# Patient Record
Sex: Male | Born: 1960
Health system: Southern US, Community
[De-identification: ages and names within clinical notes are randomized; demographics above are authoritative.]

## PROBLEM LIST (undated history)

## (undated) DIAGNOSIS — R109 Unspecified abdominal pain: Secondary | ICD-10-CM

## (undated) DIAGNOSIS — M7580 Other shoulder lesions, unspecified shoulder: Secondary | ICD-10-CM

## (undated) DIAGNOSIS — K2289 Other specified disease of esophagus: Secondary | ICD-10-CM

## (undated) DIAGNOSIS — E039 Hypothyroidism, unspecified: Secondary | ICD-10-CM

## (undated) DIAGNOSIS — R0681 Apnea, not elsewhere classified: Secondary | ICD-10-CM

## (undated) DIAGNOSIS — G4730 Sleep apnea, unspecified: Secondary | ICD-10-CM

## (undated) DIAGNOSIS — M542 Cervicalgia: Secondary | ICD-10-CM

## (undated) DIAGNOSIS — M1990 Unspecified osteoarthritis, unspecified site: Secondary | ICD-10-CM

## (undated) DIAGNOSIS — K228 Other specified diseases of esophagus: Secondary | ICD-10-CM

## (undated) DIAGNOSIS — Z87442 Personal history of urinary calculi: Secondary | ICD-10-CM

## (undated) DIAGNOSIS — K760 Fatty (change of) liver, not elsewhere classified: Secondary | ICD-10-CM

## (undated) DIAGNOSIS — I10 Essential (primary) hypertension: Secondary | ICD-10-CM

## (undated) DIAGNOSIS — E119 Type 2 diabetes mellitus without complications: Secondary | ICD-10-CM

## (undated) DIAGNOSIS — J45909 Unspecified asthma, uncomplicated: Secondary | ICD-10-CM

## (undated) HISTORY — DX: Unspecified abdominal pain: R10.9

## (undated) HISTORY — PX: COLONOSCOPY: SHX174

## (undated) HISTORY — DX: Other specified disease of esophagus: K22.89

## (undated) HISTORY — DX: Other specified diseases of esophagus: K22.8

## (undated) HISTORY — PX: NASAL FRACTURE SURGERY: SHX718

## (undated) HISTORY — PX: OTHER SURGICAL HISTORY: SHX169

## (undated) HISTORY — DX: Other shoulder lesions, unspecified shoulder: M75.80

## (undated) HISTORY — DX: Personal history of urinary calculi: Z87.442

## (undated) HISTORY — DX: Apnea, not elsewhere classified: R06.81

## (undated) HISTORY — PX: KNEE SURGERY: SHX244

## (undated) HISTORY — PX: ELBOW SURGERY: SHX618

## (undated) HISTORY — PX: FRACTURE SURGERY: SHX138

## (undated) HISTORY — DX: Cervicalgia: M54.2

---

## 1998-07-26 ENCOUNTER — Emergency Department (HOSPITAL_COMMUNITY): Admission: EM | Admit: 1998-07-26 | Discharge: 1998-07-27 | Payer: Self-pay | Admitting: Emergency Medicine

## 1999-03-15 ENCOUNTER — Emergency Department (HOSPITAL_COMMUNITY): Admission: EM | Admit: 1999-03-15 | Discharge: 1999-03-15 | Payer: Self-pay | Admitting: *Deleted

## 1999-06-16 ENCOUNTER — Encounter: Admission: RE | Admit: 1999-06-16 | Discharge: 1999-09-14 | Payer: Self-pay | Admitting: Anesthesiology

## 1999-08-27 ENCOUNTER — Inpatient Hospital Stay (HOSPITAL_COMMUNITY): Admission: RE | Admit: 1999-08-27 | Discharge: 1999-08-30 | Payer: Self-pay | Admitting: Neurosurgery

## 1999-08-27 ENCOUNTER — Encounter: Payer: Self-pay | Admitting: Neurosurgery

## 1999-09-27 ENCOUNTER — Ambulatory Visit (HOSPITAL_COMMUNITY): Admission: RE | Admit: 1999-09-27 | Discharge: 1999-09-27 | Payer: Self-pay | Admitting: Neurosurgery

## 1999-09-27 ENCOUNTER — Encounter: Payer: Self-pay | Admitting: Neurosurgery

## 2000-01-04 ENCOUNTER — Ambulatory Visit (HOSPITAL_COMMUNITY): Admission: RE | Admit: 2000-01-04 | Discharge: 2000-01-04 | Payer: Self-pay | Admitting: Neurosurgery

## 2000-01-04 ENCOUNTER — Encounter: Payer: Self-pay | Admitting: Neurosurgery

## 2000-06-03 ENCOUNTER — Encounter: Payer: Self-pay | Admitting: Emergency Medicine

## 2000-06-03 ENCOUNTER — Emergency Department (HOSPITAL_COMMUNITY): Admission: EM | Admit: 2000-06-03 | Discharge: 2000-06-03 | Payer: Self-pay | Admitting: Emergency Medicine

## 2001-10-11 ENCOUNTER — Encounter: Admission: RE | Admit: 2001-10-11 | Discharge: 2001-10-11 | Payer: Self-pay | Admitting: Urology

## 2001-10-11 ENCOUNTER — Encounter: Payer: Self-pay | Admitting: Urology

## 2002-09-19 ENCOUNTER — Emergency Department (HOSPITAL_COMMUNITY): Admission: EM | Admit: 2002-09-19 | Discharge: 2002-09-19 | Payer: Self-pay | Admitting: Emergency Medicine

## 2003-09-26 ENCOUNTER — Inpatient Hospital Stay (HOSPITAL_COMMUNITY): Admission: EM | Admit: 2003-09-26 | Discharge: 2003-09-29 | Payer: Self-pay | Admitting: Emergency Medicine

## 2004-01-16 ENCOUNTER — Ambulatory Visit (HOSPITAL_COMMUNITY): Admission: RE | Admit: 2004-01-16 | Discharge: 2004-01-16 | Payer: Self-pay | Admitting: Orthopedic Surgery

## 2004-12-23 ENCOUNTER — Encounter: Admission: RE | Admit: 2004-12-23 | Discharge: 2004-12-23 | Payer: Self-pay | Admitting: Internal Medicine

## 2004-12-30 ENCOUNTER — Ambulatory Visit: Payer: Self-pay | Admitting: Internal Medicine

## 2005-01-04 ENCOUNTER — Ambulatory Visit: Payer: Self-pay | Admitting: Internal Medicine

## 2005-10-13 ENCOUNTER — Emergency Department (HOSPITAL_COMMUNITY): Admission: EM | Admit: 2005-10-13 | Discharge: 2005-10-14 | Payer: Self-pay | Admitting: Emergency Medicine

## 2005-12-22 ENCOUNTER — Ambulatory Visit: Payer: Self-pay | Admitting: Internal Medicine

## 2005-12-26 ENCOUNTER — Ambulatory Visit: Payer: Self-pay | Admitting: Cardiovascular Disease

## 2006-01-03 ENCOUNTER — Ambulatory Visit: Payer: Self-pay | Admitting: Gastroenterology

## 2006-01-05 ENCOUNTER — Ambulatory Visit: Payer: Self-pay | Admitting: Internal Medicine

## 2006-08-31 ENCOUNTER — Ambulatory Visit (HOSPITAL_COMMUNITY): Admission: RE | Admit: 2006-08-31 | Discharge: 2006-08-31 | Payer: Self-pay | Admitting: Orthopedic Surgery

## 2006-10-28 ENCOUNTER — Emergency Department (HOSPITAL_COMMUNITY): Admission: EM | Admit: 2006-10-28 | Discharge: 2006-10-28 | Payer: Self-pay | Admitting: Emergency Medicine

## 2007-01-16 ENCOUNTER — Encounter: Admission: RE | Admit: 2007-01-16 | Discharge: 2007-01-16 | Payer: Self-pay | Admitting: Internal Medicine

## 2007-01-23 ENCOUNTER — Encounter: Admission: RE | Admit: 2007-01-23 | Discharge: 2007-01-23 | Payer: Self-pay | Admitting: Internal Medicine

## 2007-07-11 ENCOUNTER — Observation Stay (HOSPITAL_COMMUNITY): Admission: EM | Admit: 2007-07-11 | Discharge: 2007-07-12 | Payer: Self-pay | Admitting: Emergency Medicine

## 2007-12-11 ENCOUNTER — Encounter: Admission: RE | Admit: 2007-12-11 | Discharge: 2007-12-11 | Payer: Self-pay | Admitting: Rheumatology

## 2009-01-24 ENCOUNTER — Ambulatory Visit (HOSPITAL_COMMUNITY): Admission: EM | Admit: 2009-01-24 | Discharge: 2009-01-24 | Payer: Self-pay | Admitting: Emergency Medicine

## 2009-09-01 ENCOUNTER — Encounter: Admission: RE | Admit: 2009-09-01 | Discharge: 2009-09-01 | Payer: Self-pay | Admitting: Neurosurgery

## 2009-09-03 ENCOUNTER — Encounter: Admission: RE | Admit: 2009-09-03 | Discharge: 2009-09-03 | Payer: Self-pay | Admitting: Neurosurgery

## 2009-11-20 ENCOUNTER — Encounter: Payer: Self-pay | Admitting: Internal Medicine

## 2009-12-18 ENCOUNTER — Encounter: Admission: RE | Admit: 2009-12-18 | Discharge: 2009-12-18 | Payer: Self-pay | Admitting: Neurosurgery

## 2010-03-05 ENCOUNTER — Encounter
Admission: RE | Admit: 2010-03-05 | Discharge: 2010-03-05 | Payer: Self-pay | Source: Home / Self Care | Attending: Physical Medicine and Rehabilitation | Admitting: Physical Medicine and Rehabilitation

## 2010-04-20 NOTE — Letter (Signed)
Summary: Colonoscopy Letter  [facility]  [address] [place], [place] [address]   Phone: [phone]  Fax: [phone]      November 20, 2009 MRN: [id number]   Kevin Olsen [address] [place], [place]  [address]   Dear Mr. BRIGHT,   According to your medical record, it is time for you to schedule a Colonoscopy. The American Cancer Society recommends this procedure as a method to detect early colon cancer. Patients with a family history of colon cancer, or a personal history of colon polyps or inflammatory bowel disease are at increased risk.  This letter has been generated based on the recommendations made at the time of your procedure. If you feel that in your particular situation this may no longer apply, please contact our office.  Please call our office at [phone] to schedule this appointment or to update your records at your earliest convenience.  Thank you for cooperating with [place] to provide you with the very best care possible.   Sincerely,  Wilhemina Bonito. Marina Goodell, M.D.  [facility] [phone]

## 2010-06-23 LAB — BASIC METABOLIC PANEL
BUN: 14 mg/dL (ref 6–23)
CO2: 27 mEq/L (ref 19–32)
Chloride: 106 mEq/L (ref 96–112)
Creatinine, Ser: 1.07 mg/dL (ref 0.4–1.5)
Potassium: 4.1 mEq/L (ref 3.5–5.1)

## 2010-06-23 LAB — CBC
HCT: 47.3 % (ref 39.0–52.0)
MCHC: 34.8 g/dL (ref 30.0–36.0)
MCV: 106 fL — ABNORMAL HIGH (ref 78.0–100.0)
Platelets: 196 10*3/uL (ref 150–400)
RBC: 4.46 MIL/uL (ref 4.22–5.81)
WBC: 11.2 10*3/uL — ABNORMAL HIGH (ref 4.0–10.5)

## 2010-06-23 LAB — DIFFERENTIAL
Basophils Relative: 1 % (ref 0–1)
Eosinophils Absolute: 0 10*3/uL (ref 0.0–0.7)
Eosinophils Relative: 0 % (ref 0–5)
Lymphs Abs: 2.4 10*3/uL (ref 0.7–4.0)
Monocytes Relative: 6 % (ref 3–12)

## 2010-08-03 NOTE — Op Note (Signed)
NAMEALPER, GUILMETTE             ACCOUNT NO.:  [id number]   MEDICAL RECORD NO.:  [id number]          PATIENT TYPE:  AMB   LOCATION:  DAY                          FACILITY:  [facility]   PHYSICIAN:  Madlyn Frankel. Charlann Boxer, M.D.  DATE OF BIRTH:  [birth year]   DATE OF PROCEDURE:  08/31/2006  DATE OF DISCHARGE:                               OPERATIVE REPORT   PREOPERATIVE DIAGNOSIS:  Right knee medial meniscal tear associated with  chondromalacia.   POSTOPERATIVE DIAGNOSIS/FINDINGS:  1. Complex tear to the posterior horn of the medial meniscus.  2. Grade 2-3 changes over limited areas of the medial compartment of      the knee, primarily along the medial edge along where there was a      band of synovium present.  Otherwise, just some generalized grade 2      changes.  3. Focal area of 5-10 mm area in the apex of the patella, grade 2-3      changes with flaps of cartilage.  4. Synovitic band along the anterior medial aspect of the knee.   PROCEDURES:  1. Right knee diagnostic and operative arthroscopy with medial      meniscectomy.  2. Medial patellofemoral chondroplasty.  3. Limited synovectomy.   SURGEON:  Madlyn Frankel. Charlann Boxer, M.D.   ASSISTANT:  None.   ANESTHESIA:  General LMA plus a postoperative local anesthetic block.   BLOOD LOSS:  None.   COMPLICATIONS:  None.   INDICATION FOR PROCEDURE:  Mr. Temme is a 50 year old gentleman who  presented to the office for evaluation of some right knee pain.  He had  presented with medial-sided mechanical symptoms.  He wished to proceed  with knee MRI, which confirmed concerns for medial meniscal tear that  was described as complex.  I reviewed with him treatment options  including nonoperative versus operative intervention and he wished to  proceed with surgical intervention due to the persistence of his  symptoms despite conservative attempts.  The risks of infection and DVT  were all reviewed as well as potential progression of arthritis  and  persistent pain postop.  Consent was obtained.   PROCEDURE IN DETAIL:  The patient was brought to operative theater.  Once adequate anesthesia and preoperative antibiotics administered, the  patient was positioned with his right leg in a leg holder.  Right lower  extremity was prepped and draped in the standard fashion.  Then  landmarks were identified.  Portal sites were injected with 1% lidocaine  without epinephrine.   At this point inferior lateral portal was entered for diagnostic  purposes.  Outflow cannula set.  Diagnostic evaluation of the knee  revealed the above-noted findings.  A probe was inserted through the  medial portal site.  The complex meniscal tear identified.  Biting  basket was used amputate the anterior horn portion of this, followed by  use of the 3.5 Cuda shaver, which was adequate enough to debride the  remaining meniscus.  I suctioned out probably a 1.5 cm area of torn  meniscus.   Following this I reinserted the biting basket in order to debride some  the posterior horn and superior leaflet back to a stable level, further  contouring this with a 3.5 shaver.  Following this 3.5 shaver was used  to debride some of the chondral flaps that were noted along this medial  aspect of the medial femoral condyle.  Once this compartment was free of  any major debris, it looked very good despite having a truncated  meniscus at this point.  I removed approximately 30-40% of the meniscus,  primarily along the posterior horn to midbody region.  Anteriorly a  synovectomy was carried out further exposing some the areas of this  patellofemoral change.  I used the 3.5 shaver for this synovial  debridement in addition to the chondroplasty of the apex of the patella.  The lateral compartment was relatively intact, a little bit of  degeneration to the central portion of the lateral meniscus but no frank  tearing.  The anterior cruciate appeared to be intact.  Following  this  reexamination of the knee, the instrumentation was removed.  Portal  sites were reapproximated using a 4-0 nylon.  I then injected the knee  with 0.25% Marcaine with epinephrine.   At this point the wound was cleaned, dried and dressed sterilely with  Adaptic dressing sponges and a bulky wrap.  He was brought the to  recovery room extubated and without complication.      Madlyn Frankel Charlann Boxer, M.D.  Electronically Signed     MDO/MEDQ  D:  08/31/2006  T:  08/31/2006  Job:  [id number]

## 2010-08-03 NOTE — Discharge Summary (Signed)
NAMETRAYSON, STITELY             ACCOUNT NO.:  [id number]   MEDICAL RECORD NO.:  [id number]          PATIENT TYPE:  EMS   LOCATION:  ED                           [facility]ACILITY:  [facility]   PHYSICIAN:  Tera Mater. Evlyn Kanner, M.D. DATE OF BIRTH:  07/06/1960   DATE OF ADMISSION:  07/11/2007  DATE OF DISCHARGE:                               DISCHARGE SUMMARY   Mr. Opdahl is a 50-year white male with a history of prior diverticular  disease, hypertension and hypothyroidism.  He began feeling ill earlier  this week with a nonlocalized lower abdominal pain that is  infraumbilical.  This became progressively worse and he presented to the  emergency room today for evaluation.  Workup by the emergency room team  has noted that he has a repeat diverticulitis flare.  The patient notes  that fairly often he has difficulties with mild flares of abdominal  pain.  Last night he had chills and sweats suggesting he might have a  fever but he did not measure his temperature.  He had some anorexia and  barely had a sandwich yesterday.  This was his last food intake.  He had  been under more stress recently and his diet has not been as good.  His  weight has been up some.  He noticed no headache.  He has had no visual  complaints.  His depression is getting worse.  He notes no breathing  trouble or chest pain.  His bowels worked fairly well.  He has noticed  no blood.  He has had no right upper quadrant pain.  He has had no  dysuria or urinary symptoms.  He does have some chronic back pain.  He  has otherwise had no unusual travels, exposures, or medication changes.   PAST MEDICAL HISTORY:  For this patient includes a history of  hypertension, depression, primary hypothyroidism, obstructive sleep  apnea in 2008, non-alcoholic liver disease, gastritis in 1999, colon  polyps in 2003 but a colonoscopy in 2006, a history of diverticulitis  with a contained perforation in July of 2005, a history of  nephrolithiasis.   He has had skull fracture with facial reconstruction  and nasal surgery, elbow surgery, and L5-S1 laminectomy.   SOCIAL HISTORY:  Is married with 2 children.  He is an ex-tobacco smoker  quite a while back.   FAMILY HISTORY:  Positive for colon polyps.  Other testing included a  normal Cardiolite in 2006 and a question of a goiter on x-ray in 2008.   ALLERGIES:  NONE.   MEDICATIONS LISTED:  Include Synthroid 75 mcg daily, p.r.n. ibuprofen,  Lotensin 20 daily, and tramadol 50 three times a day p.r.n.   On exam here in the emergency room we have an older than stated age  white male lying quietly on a stretcher in no apparent distress at the  moment.  Sclerae anicteric.  Face symmetric.  The extraocular movements are  intact.  Dentition appears intact.  Neck is supple and no JVD is present.  I could not appreciate any  thyromegaly.  Lungs clear without wheezes, rales or rhonchi.  Heart  is regular and distant.  Abdomen is large, obese with a midline ventral hernia in the upper part.  In the lower part he is tender, in both lower quadrants, in the  suprapubic area.  No rebound is present.  Bowel sounds are hypoactive  but present in all quadrants.  No masses or pulsations are present.  Extremities show strong distal pulses without edema.  The patient is awake, alert.  His mentation is good.  Speech is clear.  No tremors present.  No hallucinations or delusions present.  Skin exam was warm and dry with good turgor.   LAB DATA:  Here in the ER revealed a white count of 13,900, hemoglobin  16.9, MCV 102.7, platelets 205,000.  A urinalysis was with small blood,  trace ketones and 0-2 white and 0-2 red cells.  The chemistries reveal  sodium 141, potassium 3.7, chloride 106, CO2 25, BUN 12, creatinine  1.14, glucose 111, alkaline phosphatase is 85, SGOT is 19, SGPT is 24,  total protein is 7.7, albumin is 4.2, calcium is 9.5.  His radiology  studies included a CT of the abdomen and pelvis  today with diffuse fatty  infiltration of the liver, normal spleen, normal adrenals.  Right kidney  is normal, cyst on the lower pole of the left kidney that is unchanged.  Appendix was normal.  The bowel loops revealed no bowel obstruction.  There is no free fluid or abnormal fluid collections.  Acute  diverticulitis affecting the mid sigmoid colon with a focal  microperforation and a tiny focus of extraluminal gas on image 78.  No  abscess.   In summary, we have a 50 year old white male with known diverticular  disease, now presenting with a repeat bout of diverticulitis.  The  patient seems to be having some significant trouble with this more often  than predicted and he is asking about more definitive therapy.  We are  going to decide about doing that based on how he does here.  He is going  to be getting IV Flagyl and Cipro, some fluids and pain management  overnight, and will advance his diet tomorrow and see how he does with  his microperforation.  If he gets any worse, we will obviously consult  surgery.  Dr. Darnell Level has seen him before.  His blood pressure is  below normal and his Lotensin will be held.  His Synthroid will be okay  overnight without getting that.  Pain management and hydration are major  issues.  His depression is worse and we need to talk about starting him  on something for that, which will be started tomorrow.           ______________________________  Tera Mater Evlyn Kanner, M.D.     SAS/MEDQ  D:  07/11/2007  T:  07/11/2007  Job:  [id number]   cc:   Wilhemina Bonito. Marina Goodell, MD  [address]  [place]  [place] [address]

## 2010-08-06 NOTE — H&P (Signed)
NAME:  Kevin Olsen, Kevin Olsen                       ACCOUNT NO.:  [id number]   MEDICAL RECORD NO.:  [id number]                   PATIENT TYPE:  EMS   LOCATION:  ED                                   FACILITY:  [facility]   PHYSICIAN:  Velora Heckler, M.D.                DATE OF BIRTH:  10-12-1960   DATE OF ADMISSION:  09/26/2003  DATE OF DISCHARGE:                                HISTORY & PHYSICAL   REFERRED BY:  Carren Rang, M.D.   CHIEF COMPLAINT:  Abdominal pain.   HISTORY OF PRESENT ILLNESS:  The patient is a pleasant 50 year old white  male who presents with his wife to the emergency department with two history  of abdominal pain. This was initially intermittent but became more  persistent and more severe. He localizes it to the mid epigastrium. The  patient notes mild nausea. He has experienced chills. He had a low grade  fever on presentation. The patient was seen in the emergency department. He  has a history of kidney stones and therefore underwent CT scan to rule out  urinary tract calculi.  No evidence of urinary tract calculi or  hydronephrosis was seen. However, there was an acute inflammatory process  around the splenic flexure with extensive extraluminal gas throughout the  mesentery and omental fat.  There was no free air or abscess. Diagnosis  tentatively is ruptured diverticulitis with contained perforation read by  Gerrianne Scale, M.D. from radiology.  Laboratory studies showed an  elevated white count of 13,000 with a left shift. General surgery was  contacted for evaluation.   PRIMARY CARE PHYSICIAN:  Geoffry Paradise, M.D.   PAST MEDICAL HISTORY:  1. History of lumbar disk surgery on two occasions.  2. History of frontal skull fracture with reconstruction.  3. History of nephrolithiasis.  4. History of colonoscopy by [facility], 2002.  5. History of knee injury.  6. History of hypothyroidism.   MEDICATIONS:  1. Celebrex  2. Synthroid 75 mcg daily.   ALLERGIES:  None known.   SOCIAL HISTORY:  The patient works as a Engineer, structural as a  Microbiologist. He is uninsured. He quit smoking 20 years ago.  He drinks 2-4  beers per day. He denies illicit drug use.  He is accompanied by his wife.  He lives in [place].   REVIEW OF SYMPTOMS:  Fifteen system review without significant other finding  except as noted above.   FAMILY HISTORY:  Noncontributory.   PHYSICAL EXAMINATION:  GENERAL:  A 50 year old moderately obese white male  in mild discomfort on a stretcher in the emergency department.  VITAL SIGNS:  Temperature 100.1, blood pressure 144/97, pulse 106,  respirations 20, O2 saturation 98%.  HEENT:  Shows him to be normocephalic, atraumatic, sclerae are clear.  Conjunctiva are clear. Pupils are equal and reactive at 2 mm.  Dentition is  good.  Mucous membranes are dry, voice quality  is normal. Anterior  examination, neck shows it to be symmetric, palpation shows no thyroid  nodularity. There is no anterior, posterior or cervical lymphadenopathy.  LUNGS:  Clear to auscultation without rales, rhonchi or wheezes.  CARDIAC:  Regular rate and rhythm without murmur. Peripheral pulses are  full.  ABDOMEN:  Soft, obese without surgical wounds. There is no evidence of  hernia. There is mild tenderness to percussion in the right lower quadrant.  There is mild tenderness to palpation across the lower abdomen. There is  rebound tenderness, however, in the left mid abdomen. There is no  hepatosplenomegaly and no palpable mass.  EXTREMITIES:  Nontender without edema.  NEUROLOGIC:  The patient is alert and oriented without focal deficit.   LABORATORY DATA:  White count 13.3, hemoglobin 15.4, hematocrit 44.3%,  platelet count 196,000.  Differential shows 87% neutrophils, 8% lymphocytes,  4% monocytes. Chemistry profile is notable for a potassium of 3.3 and a  total bilirubin of 2.8. Amylase and lipase are normal.   Radiographic  studies:  CT scan of the abdomen and pelvis with findings as  noted above.   IMPRESSION:  1. Acute diverticulitis.  2. Probable contained perforation of acute diverticulitis without free air     or gross abscess or spillage.  3. Hypothyroidism.  4. Hypokalemia.   PLAN:  1. Admission to [facility].  2. Initiation of intravenous antibiotics.  3. Control of pain with intravenous narcotics and Toradol.  4. Possible operative intervention if the patient fails to improve     clinically over the next 24-48 hours.                                               Velora Heckler, M.D.    TMG/MEDQ  D:  09/26/2003  T:  09/26/2003  Job:  [id number]   cc:   Geoffry Paradise, M.D.  [address]  [place]  [place] [address]  Fax: [phone]

## 2010-08-06 NOTE — Assessment & Plan Note (Signed)
[facility]                           GASTROENTEROLOGY OFFICE NOTE   RIAD, WAGLEY                    MRN:          [id number]  DATE:01/05/2006                            DOB:          [birth year]    HISTORY:  Mr. Brimage presents today for followup.  He was evaluated December 22, 2005, regarding right lower quadrant and right flank pain.  See that  dictation for details.  He underwent colonoscopy 1 year ago yesterday which  was normal except for diverticulosis.  He does have a history of colon  polyps.  At the time of his evaluation 2 weeks ago, the cause of his pain  was uncertain though was felt unlikely to be a primary gastrointestinal  disorder.  Screening laboratories were obtained.  CBC was unremarkable.  Comprehensive metabolic panel was normal except for mildly elevated glucose.  Previous urinalysis at Dr. Lanell Matar office was negative.  CT scan of the  abdomen performed December 26, 2005, revealed fatty infiltration of the liver,  prior evidence of lumbar surgery with fusion, and multiple left renal cysts  and tiny nonobstructing right renal calculus.  The patient does have prior  history of kidney stones.  He also underwent an abdominal ultrasound which  revealed fatty liver.  No gallstones.  He presents today for followup.  Really, his discomfort is unchanged.  Possibly less intense.  Again, the  discomfort seems to be exacerbated by certain movements.  We reviewed all of  the above findings.  He has noticed some radiation of discomfort into the  legs since his last visit.   PHYSICAL EXAMINATION:  Finds a well-appearing male in no acute distress.  Blood pressure 120/90, heart rate is 56, weight is 273 pounds.  His abdomen  is obese and soft without tenderness, mass or hernia.  Some discomfort to  palpation in the right flank and back region.   IMPRESSION:  1. Problems with right-sided discomfort, most likely musculoskeletal in   nature.  Nothing to suggest a primary gastrointestinal disorder.  2. Fatty liver.  3. History of adenomatous colon polyps.   RECOMMENDATIONS:  We discussed today importance of weight loss which may  help his musculoskeletal discomfort if it is related to his back, as well as  would be helpful in terms of fatty liver.  Otherwise, he should treat  himself symptomatically with antiinflammatories or over-the-  counter analgesics in recommended dosages.  If after a few weeks his problem  persists, he agrees to return to the care of Dr. Jacky Kindle for additional  assessment or treatment.            ______________________________  Wilhemina Bonito. Eda Keys., MD   JNP/MedQ DD:  01/05/2006 DT:  01/07/2006 Job #:  [id number]   cc:   Geoffry Paradise, M.D.

## 2010-08-06 NOTE — Discharge Summary (Signed)
NAME:  Kevin Olsen, Kevin Olsen                       ACCOUNT NO.:  [id number]   MEDICAL RECORD NO.:  [id number]                   PATIENT TYPE:  INP   LOCATION:  0443                                 FACILITY:  [facility]   PHYSICIAN:  Velora Heckler, M.D.                DATE OF BIRTH:  1960-11-24   DATE OF ADMISSION:  09/26/2003  DATE OF DISCHARGE:  09/29/2003                                 DISCHARGE SUMMARY   REASON FOR ADMISSION:  Acute diverticulitis with contained perforation.   HISTORY OF PRESENT ILLNESS:  The patient is a 50 year old white male,  presents to the emergency department with a 2-day history of abdominal pain.  The patient had initial intermittent mid abdominal pain.  This was  associated with mild nausea.  He developed chills and fever.  He presented  to the emergency department where a CT scan was performed to rule out  urinary tract calculi.  No evidence of nephrolithiasis was found.  However,  there was an acute inflammatory process around the splenic flexure of the  colon with extraluminal gas throughout the mesentery and omental fat.  No  free air and no abscess was identified.  White blood cell count was elevated  at 13,000.  The patient was seen and admitted on the general surgical  service.   HOSPITAL COURSE:  The patient was placed on bowel rest and intravenous  hydration.  He was started on intravenous Unasyn.  The patient did well.  He  started on a clear liquid diet which he tolerated and was advanced to a full  liquid diet.  The patient had complete resolution of his pain.  He remained  afebrile.  Follow up white blood cell count was normal at 7000.  The patient  was prepared for discharge home on the fourth hospital day.   DISCHARGE PLANNING:  The patient is discharged home today, July 11th, in  good condition, tolerating a full liquid diet and ambulating independently.  The patient will be seen back in my office at [facility] in 2  weeks.   Discharge medications include Unasyn 875 mg b.i.d. for 10 days.  The  patient will require a diagnostic study of the colon, either colonoscopy or  barium enema, in 6 weeks.   FINAL DIAGNOSIS:  Acute diverticulitis with contained perforation.   CONDITION ON DISCHARGE:  Improved.                                               Velora Heckler, M.D.    TMG/MEDQ  D:  09/29/2003  T:  09/29/2003  Job:  [id number]   cc:   Geoffry Paradise, M.D.  [address]  [place]  [place] [address]  Fax: [phone]

## 2010-08-06 NOTE — Op Note (Signed)
[place]  Patient:    Kevin Olsen, Kevin Olsen                    MRN: [id number] Proc. Date: 08/27/99 Adm. Date:  [id number] Disc. Date: [id number] Attending:  Donn Pierini                           Operative Report  PREOPERATIVE DIAGNOSIS: Recurrent L5-S1 herniated nucleus pulposus with radiculopathy.  POSTOPERATIVE DIAGNOSIS: Recurrent L5-S1 herniated nucleus pulposus with radiculopathy.  OPERATION/PROCEDURE: Re-exploration of right L5-S1 laminotomy with complete L5-S1 laminectomy and bilateral L5-S1 foraminotomy, with bilateral L5-S1 redo microdiskectomy and posterior lumbar interbody fusion utilizing tangent allograft wedges and local autograft, with posterolateral fusion with pedicle screw fixation and local autograft and microdissection.  SURGEON: Julio Sicks, M.D.  ASSISTANT: Reinaldo Meeker, M.D.  ANESTHESIA: General endotracheal.  INDICATIONS FOR PROCEDURE: Mr. Abalos is a 50 year old male who is status post previous right-sided L5-S1 laminotomy and diskectomy.  The patient presents now with severe recurrent back and left lower extremity pain.  The patient has failed efforts at conservative management including epidural steroid injections.  MRI scanning demonstrates a large leftward central to paracentral disk herniation with compression of the thecal sac and exiting S1 nerve root on the left.  The patient has been counseled as to his options and he has decided to proceed with redo laminotomy and diskectomy with subsequent fusion at the effected level.  The patient is aware of the risks and benefits of the surgery and wishes to proceed.  DESCRIPTION OF PROCEDURE: The patient was taken to the operating room and placed on the operating room table in the supine position.  After an adequate level of anesthesia was achieved the patient was positioned prone onto a Wilson frame and appropriately padded.  The lumbar region was  prepped sterilely and a 10 blade used to make a skin incision overlying the L5-S1 interspace.  This was carried down sharply in the midline and subperiosteal dissection performed bilaterally exposing the lamina of L4, L5, and S1.  The transverse processes of L5 and ala of S1 were exposed.  A deep self-retaining retractor was placed and x-ray was taken.  The level was confirmed.  The L5-S1 laminotomy on the right side was then re-explored and dissected free.  The remaining lamina of L5 was completely removed using the high speed drill and Kerrison rongeurs.  The superior one-third of the lamina of S1 was removed using the high speed drill and Kerrison rongeurs.  The ligamentum flavum was then elevated and resected in piecemeal fashion, exposing the underlying thecal sac and exiting S1 nerve root on the left side.  Epidural scar was dissected free on the right side, exposing the underlying thecal sac and exiting S1 nerve root on the right side.  Foraminotomy of the L5 foramen was then performed exposing the L5 nerve root bilaterally.  At this point the microscope was brought onto the field and used for microdissection of the disk herniation and overlying nerve roots.  Starting first on the patients left side the epidural venous plexus was coagulated and cut.  The thecal sac and S1 nerve root were gently mobilized and retracted toward the midline.  The disk space was then isolated and incised with a 15 blade in rectangular fashion.  A wide disk space clean-out was then achieved using pituitary rongeurs, upward angled pituitary rongeurs, and Ebstein curets.  Almost remnants of the  disk herniation were completely removed.  Attention was then paid to the contralateral side.  Once again epidural scarring was dissected free and lysed using dental instruments.  The thecal sac and S1 nerve root were then mobilized and retracted toward the midline.  The disk space was then incised and aggressive  diskectomy once again performed.  An 11 mm distractor was then placed in the disk space and the disk space height opened to 11 mm.  Starting now on the patients left side, protecting the L5-S1 nerve roots as well as the thecal sac, the disk space was then reamed with a tangent reamer and then cut with a tangent chisel for a 10 x 20 mm tangent wedge.  This was then impacted into place under fluoroscopic guidance.  The retractor systems were removed.  There was no evidence of injury to thecal sac or nerve roots.  The bone graft was well positioned.  The distractors were removed from the contralateral side and once again protecting the nerve roots and thecal sac the disk space was reamed and then cut with the chisel.  Morcellized autograft was then packed into the interspace.  A second 10 x 20 mm tangent wedge was then impacted into place.  Intraoperative fluoroscopy revealed good position of the bone graft at the proper operative level with normal alignment of the spine.  The wound as then copiously irrigated with antibiotic solution.  All nerve roots were found to be free.  There was no evidence of any disk herniation.  The pedicles were then isolated using fluoroscopic guidance and surface landmarks.  The L5 pedicle was then uncovered using the high speed drill.  A pedicle awl was then used to pass into the pedicle of L5 bilaterally.  This was done under fluoroscopic guidance.  The pedicle awl hole was then probed and found to be solid in bone.  It was then subsequently tapped by a 5.25 mm screw tap.  Once again this tract was found to be solidly within bone.  The 6.75 x 40 mm variable angled SDRS pedicle screws were then placed bilaterally at the L5 level.  The pedicles of S1 were isolated.  The superficial cortex was removed using the high speed drill.  The pedicle awl was then once again inserted and the awl hole was found to be solidly within bone bilaterally.  Each awl hole was then  tapped using a 525 tap and 6.75 x 35 mm SDRS.  Pedicle screws were then placed at the S1 level bilaterally.  A short segment titanium rod was then placed over the screw heads at L5-S1 and  this rod was then contoured.  Locking caps were then engaged in sequential fashion to place the construct under compression.  Final images revealed good positioning of the bone grafts and hardware at the proper operative level in both the lateral and AP planes.  Instruments were passed along the course of the L5-S1 nerve roots bilaterally and these were found to be free.  There was no evidence of pedicle violation at either level.  The transverse process and sacral ala were then decorticated using the high speed drill.  Morcellized autograft was then packed posterolaterally.  The thecal sac and L5-S1 nerve roots were inspected one final time and found to be free.  The wound was irrigated one final time with antibiotic solution and Gelfoam was placed topically in the epidural space for hemostasis, which was found to be good.  A medium Hemovac drain was left in  the epidural space and exited through a separate stab incision.  The wound was then closed in layers with Vicryl sutures and staples applied to the surface.  There were no apparent complications.  The patient tolerated the procedure well and was returned to the recovery room postoperatively. DD:  08/27/99 TD:  08/31/99 Job: [id number]

## 2010-08-06 NOTE — Assessment & Plan Note (Signed)
[facility]                           GASTROENTEROLOGY OFFICE NOTE   ERVING, SASSANO                    MRN:          [id number]  DATE:12/22/2005                            DOB:          10/07/1960    REFERRING PHYSICIAN:  Geoffry Paradise, M.D.   REASON FOR CONSULTATION:  Abdominal pain.   HISTORY:  This is a pleasant 50 year old white male with a history of  adenomatous colon polyps and hypothyroidism.  He is referred through the  courtesy of Dr. Jacky Kindle regarding abdominal pain.  He also has a history of  kidney stones.  The patient reports a 2-3 week history of right lower  quadrant and right back pain.  The discomfort seems to be improved with  meals.  It is also improved when the patient leans certain ways, worse when  he leans to the right.  No problems with sleep.  There is no associated  nausea, vomiting, indigestion.  Some gas.  No change in bowel habits.  He  reports some dark stools that do not sound like melena.  He has had a 20  pound weight gain over the past six months.  He  had had some problems with  abdominal pain in July for which he went to the emergency room.  He thought  it was a kidney stone.  That discomfort was very different from the current  discomfort.  Workup at that time included CT scan showing just air fluid  levels in the small bowel, no other problems.  He also reports increased  frequency of urination, he is getting up at night.  He has had elevated  glucose in the past but no diagnosis of diabetes.  He does have a history of  back problems and prior back surgery in the lumbar region (fusion).   PAST MEDICAL HISTORY:  1. Hypothyroidism.  2. Kidney stones.  3. Colon polyps.  4. Perforated diverticulitis in 2005.  5. Low back problems status post lumbar fusion.   ALLERGIES:  No known drug allergies.   CURRENT MEDICATIONS:  Synthroid 75 mcg daily.   FAMILY HISTORY:  Father with colon cancer.   SOCIAL HISTORY:  The patient is married with two children.  He works as a  Human resources officer.  He smokes and uses alcohol.   REVIEW OF SYSTEMS:  Per diagnostic evaluation performed.   PHYSICAL EXAMINATION:  GENERAL:  Well appearing male in no acute distress.  VITAL SIGNS:  Blood pressure 132/80, heart rate 80, weight 274.6 pounds, 5  foot 11 inches in height.  HEENT:  Sclerae anicteric, conjunctivae pink, oral mucosa intact, no  adenopathy.  LUNGS:  Clear.  HEART:  Regular.  ABDOMEN:  Obese and soft without tenderness, mass, or herniae.  Good bowel  sounds heard.  EXTREMITIES:  Without edema.   IMPRESSION:  1. Problems with right lower quadrant and right back discomfort of      uncertain cause.  It is uncertain to me if this represents primary      gastrointestinal disorder.  This may represent renal colic or possibly      referred pain  from his back.  2. Complaints of increased urination at night.  Rule out diabetes.  3. Remote history of perforated diverticulitis.  4. History of adenomatous colon polyps.   RECOMMENDATIONS:  1. Screening laboratories today including CBC and comprehensive metabolic      panel.  2. Abdominal ultrasound to rule out gallstones.  3. CT scan of the abdomen and pelvis to evaluate the kidney, back, and      colon.  4. Continue Oxycodone for pain as prescribed by Dr. Jacky Kindle.            ______________________________  Wilhemina Bonito. Eda Keys., MD      JNP/MedQ  DD:  12/22/2005  DT:  12/23/2005  Job #:  [id number]   cc:   Geoffry Paradise, M.D.

## 2010-12-14 LAB — BASIC METABOLIC PANEL
CO2: 24
Calcium: 8.5
Creatinine, Ser: 1.21
GFR calc Af Amer: >60
Glucose, Bld: 105 — ABNORMAL HIGH

## 2010-12-14 LAB — HEPATIC FUNCTION PANEL
Albumin: 3.3 — ABNORMAL LOW
Total Protein: 6.3

## 2010-12-14 LAB — COMPREHENSIVE METABOLIC PANEL
ALT: 24
Alkaline Phosphatase: 85
CO2: 25
Chloride: 106
Glucose, Bld: 111 — ABNORMAL HIGH
Potassium: 3.7
Sodium: 141
Total Bilirubin: 2 — ABNORMAL HIGH
Total Protein: 7.7

## 2010-12-14 LAB — DIFFERENTIAL
Basophils Relative: 0
Eosinophils Absolute: 0.1
Monocytes Relative: 10
Neutrophils Relative %: 76

## 2010-12-14 LAB — CBC
Hemoglobin: 16.9
MCHC: 34.8
RBC: 4.15 — ABNORMAL LOW
RBC: 4.77
RDW: 13.5
RDW: 13.6
WBC: 13.9 — ABNORMAL HIGH

## 2010-12-14 LAB — URINALYSIS, ROUTINE W REFLEX MICROSCOPIC
Bilirubin Urine: NEGATIVE
Glucose, UA: NEGATIVE
Protein, ur: NEGATIVE
Urobilinogen, UA: 0.2

## 2010-12-14 LAB — URINE MICROSCOPIC-ADD ON

## 2011-01-03 LAB — URINE MICROSCOPIC-ADD ON

## 2011-01-03 LAB — DIFFERENTIAL
Basophils Relative: 0
Eosinophils Relative: 1
Lymphocytes Relative: 14
Monocytes Absolute: 0.7
Monocytes Relative: 6
Neutro Abs: 9.2 — ABNORMAL HIGH

## 2011-01-03 LAB — BASIC METABOLIC PANEL
BUN: 13
Chloride: 109
Creatinine, Ser: 1.27
GFR calc non Af Amer: >60

## 2011-01-03 LAB — CBC
MCV: 102.3 — ABNORMAL HIGH
WBC: 11.8 — ABNORMAL HIGH

## 2011-01-03 LAB — URINALYSIS, ROUTINE W REFLEX MICROSCOPIC
Glucose, UA: NEGATIVE
Leukocytes, UA: NEGATIVE
pH: 6.5

## 2011-01-06 LAB — PROTIME-INR
INR: 1
Prothrombin Time: 12.9

## 2011-07-26 ENCOUNTER — Encounter: Payer: Self-pay | Admitting: Internal Medicine

## 2011-08-18 ENCOUNTER — Telehealth: Payer: Self-pay | Admitting: *Deleted

## 2011-08-18 NOTE — Telephone Encounter (Signed)
NOS for previsit.  Nos answer on home number and unable to leave a message on mobile phone.  Sent NOS letter and CX'd colon

## 2011-08-23 ENCOUNTER — Ambulatory Visit (AMBULATORY_SURGERY_CENTER): Payer: BC Managed Care – PPO

## 2011-08-23 VITALS — Ht 71.0 in | Wt 261.3 lb

## 2011-08-23 DIAGNOSIS — Z1211 Encounter for screening for malignant neoplasm of colon: Secondary | ICD-10-CM

## 2011-08-23 DIAGNOSIS — Z86010 Personal history of colonic polyps: Secondary | ICD-10-CM

## 2011-08-23 MED ORDER — PEG-KCL-NACL-NASULF-NA ASC-C 100 G PO SOLR: 1.0000 | Freq: Once | ORAL | Status: AC

## 2011-08-24 ENCOUNTER — Encounter: Payer: Self-pay | Admitting: Internal Medicine

## 2011-08-31 ENCOUNTER — Encounter: Payer: Self-pay | Admitting: Internal Medicine

## 2011-08-31 ENCOUNTER — Ambulatory Visit (AMBULATORY_SURGERY_CENTER): Payer: BC Managed Care – PPO | Admitting: Internal Medicine

## 2011-08-31 VITALS — BP 137/52 | HR 76 | Temp 96.2°F | Resp 13 | Ht 71.0 in | Wt 261.0 lb

## 2011-08-31 DIAGNOSIS — Z86010 Personal history of colonic polyps: Secondary | ICD-10-CM

## 2011-08-31 DIAGNOSIS — Z1211 Encounter for screening for malignant neoplasm of colon: Secondary | ICD-10-CM

## 2011-08-31 DIAGNOSIS — Z800 Family history of malignant neoplasm of digestive organs: Secondary | ICD-10-CM

## 2011-08-31 MED ORDER — SODIUM CHLORIDE 0.9 % IV SOLN: 500.0000 mL | INTRAVENOUS | Status: DC

## 2011-08-31 NOTE — Progress Notes (Signed)
Patient did not experience any of the following events: a burn prior to discharge; a fall within the facility; wrong site/side/patient/procedure/implant event; or a hospital transfer or hospital admission upon discharge from the facility. (G8907) Patient did not have preoperative order for IV antibiotic SSI prophylaxis. (G8918)  

## 2011-08-31 NOTE — Patient Instructions (Addendum)
YOU HAD AN ENDOSCOPIC PROCEDURE TODAY AT THE [facility]: Refer to the procedure report that was given to you for any specific questions about what was found during the examination.  If the procedure report does not answer your questions, please call your gastroenterologist to clarify.  If you requested that your care partner not be given the details of your procedure findings, then the procedure report has been included in a sealed envelope for you to review at your convenience later.  YOU SHOULD EXPECT: Some feelings of bloating in the abdomen. Passage of more gas than usual.  Walking can help get rid of the air that was put into your GI tract during the procedure and reduce the bloating. If you had a lower endoscopy (such as a colonoscopy or flexible sigmoidoscopy) you may notice spotting of blood in your stool or on the toilet paper. If you underwent a bowel prep for your procedure, then you may not have a normal bowel movement for a few days.  DIET: Your first meal following the procedure should be a light meal and then it is ok to progress to your normal diet.  A half-sandwich or bowl of soup is an example of a good first meal.  Heavy or fried foods are harder to digest and may make you feel nauseous or bloated.  Likewise meals heavy in dairy and vegetables can cause extra gas to form and this can also increase the bloating.  Drink plenty of fluids but you should avoid alcoholic beverages for 24 hours.  ACTIVITY: Your care partner should take you home directly after the procedure.  You should plan to take it easy, moving slowly for the rest of the day.  You can resume normal activity the day after the procedure however you should NOT DRIVE or use heavy machinery for 24 hours (because of the sedation medicines used during the test).    SYMPTOMS TO REPORT IMMEDIATELY: A gastroenterologist can be reached at any hour.  During normal business hours, 8:30 AM to 5:00 PM Monday through Friday,  call [phone].  After hours and on weekends, please call the GI answering service at [phone] who will take a message and have the physician on call contact you.   Following lower endoscopy (colonoscopy or flexible sigmoidoscopy):  Excessive amounts of blood in the stool  Significant tenderness or worsening of abdominal pains  Swelling of the abdomen that is new, acute  Fever of 100F or higher  Following upper endoscopy (EGD)  Vomiting of blood or coffee ground material  New chest pain or pain under the shoulder blades  Painful or persistently difficult swallowing  New shortness of breath  Fever of 100F or higher  Black, tarry-looking stools  FOLLOW UP: If any biopsies were taken you will be contacted by phone or by letter within the next 1-3 weeks.  Call your gastroenterologist if you have not heard about the biopsies in 3 weeks.  Our staff will call the home number listed on your records the next business day following your procedure to check on you and address any questions or concerns that you may have at that time regarding the information given to you following your procedure. This is a courtesy call and so if there is no answer at the home number and we have not heard from you through the emergency physician on call, we will assume that you have returned to your regular daily activities without incident.  SIGNATURES/CONFIDENTIALITY: You and/or your care   partner have signed paperwork which will be entered into your electronic medical record.  These signatures attest to the fact that that the information above on your After Visit Summary has been reviewed and is understood.  Full responsibility of the confidentiality of this discharge information lies with you and/or your care-partner.  

## 2011-08-31 NOTE — Op Note (Signed)
[facility] [address] [place], [place]  [address]  COLONOSCOPY PROCEDURE REPORT  PATIENT:  Kevin, Olsen  MR#:  [id number] BIRTHDATE:  1960-03-23, 51 yrs. old  GENDER:  male ENDOSCOPIST:  Wilhemina Bonito. Eda Keys, MD REF. BY:  Surveillance Program Recall, PROCEDURE DATE:  08/31/2011 PROCEDURE:  Higher-risk screening colonoscopy G0105  ASA CLASS:  Class II INDICATIONS:  history of  colon polyps, surveillance and high-risk screening, family history of colon cancer ; index 2003 w/ hpp; f/u 2006 negative. Parent @ 50 MEDICATIONS:   MAC sedation, administered by CRNA, propofol (Diprivan) 230 mg IV  DESCRIPTION OF PROCEDURE:   After the risks benefits and alternatives of the procedure were thoroughly explained, informed consent was obtained.  Digital rectal exam was performed and revealed no abnormalities.   The LB CF-[id number] endoscope was introduced through the anus and advanced to the cecum, which was identified by both the appendix and ileocecal valve, without limitations.  The quality of the prep was excellent, using MoviPrep.  The instrument was then slowly withdrawn as the colon was fully examined. <<PROCEDUREIMAGES>>  FINDINGS:  Moderate diverticulosis was found in the sigmoid colon. Otherwise normal colonoscopy without  polyps, masses, vascular ectasias, or inflammatory changes.   Retroflexed views in the rectum revealed no abnormalities.    The time to cecum =  1:49 minutes. The scope was then withdrawn in   7:45  minutes from the cecum and the procedure completed.  COMPLICATIONS:  None  ENDOSCOPIC IMPRESSION: 1) Moderate diverticulosis in the sigmoid colon 2) Otherwise normal colonoscopy  RECOMMENDATIONS: 1) Follow up colonoscopy in 5 years (Family hx)  ______________________________ Wilhemina Bonito. Eda Keys, MD  CC:  Geoffry Paradise, MD;  The Patient  n. eSIGNED:   Wilhemina Bonito. Eda Keys at 08/31/2011 03:08 PM  Delila Spence, [phone]

## 2011-09-01 ENCOUNTER — Telehealth: Payer: Self-pay | Admitting: *Deleted

## 2011-09-01 NOTE — Telephone Encounter (Signed)
No answer left message to call if questions or concerns. 

## 2012-09-16 ENCOUNTER — Emergency Department (HOSPITAL_COMMUNITY)
Admission: EM | Admit: 2012-09-16 | Discharge: 2012-09-16 | Disposition: A | Discharge status: Home / Self Care | Payer: BC Managed Care – PPO | Attending: Emergency Medicine | Admitting: Emergency Medicine

## 2012-09-16 ENCOUNTER — Encounter (HOSPITAL_COMMUNITY): Payer: Self-pay | Admitting: Emergency Medicine

## 2012-09-16 DIAGNOSIS — Z87442 Personal history of urinary calculi: Secondary | ICD-10-CM | POA: Insufficient documentation

## 2012-09-16 DIAGNOSIS — I10 Essential (primary) hypertension: Secondary | ICD-10-CM | POA: Insufficient documentation

## 2012-09-16 DIAGNOSIS — Z8719 Personal history of other diseases of the digestive system: Secondary | ICD-10-CM | POA: Insufficient documentation

## 2012-09-16 DIAGNOSIS — Z79899 Other long term (current) drug therapy: Secondary | ICD-10-CM | POA: Insufficient documentation

## 2012-09-16 DIAGNOSIS — H1045 Other chronic allergic conjunctivitis: Secondary | ICD-10-CM | POA: Insufficient documentation

## 2012-09-16 DIAGNOSIS — Z87891 Personal history of nicotine dependence: Secondary | ICD-10-CM | POA: Insufficient documentation

## 2012-09-16 DIAGNOSIS — E119 Type 2 diabetes mellitus without complications: Secondary | ICD-10-CM | POA: Insufficient documentation

## 2012-09-16 DIAGNOSIS — T7840XA Allergy, unspecified, initial encounter: Secondary | ICD-10-CM

## 2012-09-16 DIAGNOSIS — Z8739 Personal history of other diseases of the musculoskeletal system and connective tissue: Secondary | ICD-10-CM | POA: Insufficient documentation

## 2012-09-16 HISTORY — DX: Essential (primary) hypertension: I10

## 2012-09-16 HISTORY — DX: Type 2 diabetes mellitus without complications: E11.9

## 2012-09-16 MED ORDER — METHYLPREDNISOLONE SODIUM SUCC 125 MG IJ SOLR
125.0000 mg | Freq: Once | INTRAMUSCULAR | Status: AC
  Administered 2012-09-16: 125 mg via INTRAVENOUS
  Filled 2012-09-16: qty 2

## 2012-09-16 MED ORDER — LORAZEPAM 1 MG PO TABS: 1.0000 mg | ORAL_TABLET | Freq: Three times a day (TID) | ORAL | Status: DC | PRN

## 2012-09-16 MED ORDER — DIPHENHYDRAMINE HCL 50 MG/ML IJ SOLN
25.0000 mg | Freq: Once | INTRAMUSCULAR | Status: AC
  Administered 2012-09-16: 25 mg via INTRAVENOUS
  Filled 2012-09-16: qty 1

## 2012-09-16 MED ORDER — LORAZEPAM 2 MG/ML IJ SOLN
1.0000 mg | Freq: Once | INTRAMUSCULAR | Status: AC
  Administered 2012-09-16: 1 mg via INTRAVENOUS
  Filled 2012-09-16: qty 1

## 2012-09-16 MED ORDER — FAMOTIDINE IN NACL 20-0.9 MG/50ML-% IV SOLN
20.0000 mg | Freq: Once | INTRAVENOUS | Status: AC
  Administered 2012-09-16: 20 mg via INTRAVENOUS
  Filled 2012-09-16: qty 50

## 2012-09-16 MED ORDER — PREDNISONE 50 MG PO TABS: 50.0000 mg | ORAL_TABLET | Freq: Every day | ORAL | Status: DC

## 2012-09-16 NOTE — ED Notes (Signed)
Airway remains patent, pt reports improvement in throat soreness.

## 2012-09-16 NOTE — ED Notes (Signed)
PT. REPORTS PROGRESSING SWELLING AT EYES AND FACE ONSET THIS MORNING UNRELIEVED BY OTC ALLERGY MEDICATION , RESPIRATIONS UNLABORED Bertrum Sol INTACT.

## 2012-09-16 NOTE — ED Notes (Signed)
Pt comfortable with d/c and f/u instructions. Prescriptions x2. 

## 2012-09-16 NOTE — ED Notes (Signed)
Pt reports feeling "much better", states itchiness has resolved. Dr. Preston Fleeting at bedside.

## 2012-09-16 NOTE — ED Notes (Signed)
Pt continues to c/o itchiness, edema in eyes has not changes, airway patent. EDP aware

## 2012-09-16 NOTE — ED Provider Notes (Signed)
History    CSN: [id number] Arrival date & time 09/16/12  0052  First MD Initiated Contact with Patient 09/16/12 0107     Chief Complaint  Patient presents with  . Allergic Reaction   (Consider location/radiation/quality/duration/timing/severity/associated sxs/prior Treatment) Patient is a 52 y.o. male presenting with allergic reaction. The history is provided by the patient.  Allergic Reaction He woke up this morning and noted that his eyes felt itchy and swollen. He took a dose of Benadryl without any relief. He took a nap when he got up his eyes were worse. Swelling has gotten worse through the day. They continue to be very itchy but not painful. He denies any swelling anywhere else. He specifically denies any swelling in his lips or throat or tongue. He denies any difficulty breathing or swallowing.he took a second dose of Benadryl 50 mg about an hour before coming to the emergency department. He denies any new exposures. Denies any new medications, soaps, laundry detergents, etc. Past Medical History  Diagnosis Date  . Neck pain     neck and back pain  . AC (acromioclavicular) joint bone spurs   . Abdominal pain     lower abd pain  . Esophageal pain   . Central apnea   . History of kidney stones   . Hypertension   . Diabetes mellitus without complication    Past Surgical History  Procedure Laterality Date  . Back fusion      with screws and bolts  . Colonoscopy    . Nasal fracture surgery    . Elbow surgery      left  . Knee surgery      right knee   Family History  Problem Relation Age of Onset  . Prostate cancer Father    History  Substance Use Topics  . Smoking status: Former Smoker -- 10 years    Types: Cigarettes    Quit date: 08/23/1983  . Smokeless tobacco: Never Used  . Alcohol Use: No    Review of Systems  All other systems reviewed and are negative.    Allergies  Review of patient's allergies indicates no known allergies.  Home Medications    Current Outpatient Rx  Name  Route  Sig  Dispense  Refill  . benazepril (LOTENSIN) 10 MG tablet   Oral   Take 10 mg by mouth daily.         Marland Kitchen levothyroxine (SYNTHROID) 88 MCG tablet   Oral   Take 88 mcg by mouth daily.         Marland Kitchen oxyCODONE (OXYCONTIN) 20 MG 12 hr tablet   Oral   Take 20 mg by mouth 3 (three) times daily.          BP 136/90  Pulse 79  Temp(Src) 98.2 F (36.8 C) (Oral)  Resp 14  SpO2 96% Physical Exam  Nursing note and vitals reviewed.  52 year old male, resting comfortably and in no acute distress. Vital signs are normal. Oxygen saturation is 96%, which is normal. Head is normocephalic and atraumatic. PERRLA, EOMI. Oropharynx is clear.there is bilateral periorbital erythema and swelling with slight oozing of clear fluid. Picture does not seem typical of angioedema and seems more consistent with true allergy.no intraoral swelling is seen nor is there any swelling of his lips. He has no difficulty with secretions and phonation is normal. Neck is nontender and supple without adenopathy or JVD. Back is nontender and there is no CVA tenderness. Lungs are clear without  rales, wheezes, or rhonchi. Chest is nontender. Heart has regular rate and rhythm without murmur. Abdomen is soft, flat, nontender without masses or hepatosplenomegaly and peristalsis is normoactive. Extremities have no cyanosis or edema, full range of motion is present. Skin is warm and dry without rash. Neurologic: Mental status is normal, cranial nerves are intact, there are no motor or sensory deficits.  ED Course  Procedures (including critical care time)  1. Allergic reaction, initial encounter     MDM  Acute allergic reaction with bilateral periorbital swelling. He'll be given methylprednisolone, diphenhydramine, and famotidine.  He got relatively little relief with the above-noted medications. He is given a dose of lorazepam and feels much better. On reexam, the erythema and  swelling around his eyes have improved significantly and skin is no longer weeping. Patient now tells me that he has had 3 other similar episodes although they have not been this bad. He cannot think of any, exposure to precipitate these episodes. I have recommended that he discuss with his PCP possible referral to an allergist. He is discharged with prescriptions for prednisone and lorazepam and is using over-the-counter Claritin or Zyrtec as well as diphenhydramine.  Dione Booze, MD 09/16/12 [phone]

## 2015-06-24 DIAGNOSIS — M47812 Spondylosis without myelopathy or radiculopathy, cervical region: Secondary | ICD-10-CM | POA: Diagnosis not present

## 2015-06-24 DIAGNOSIS — I10 Essential (primary) hypertension: Secondary | ICD-10-CM | POA: Diagnosis not present

## 2015-06-24 DIAGNOSIS — Z6836 Body mass index (BMI) 36.0-36.9, adult: Secondary | ICD-10-CM | POA: Diagnosis not present

## 2015-09-01 DIAGNOSIS — M461 Sacroiliitis, not elsewhere classified: Secondary | ICD-10-CM | POA: Diagnosis not present

## 2015-09-01 DIAGNOSIS — M961 Postlaminectomy syndrome, not elsewhere classified: Secondary | ICD-10-CM | POA: Diagnosis not present

## 2015-09-01 DIAGNOSIS — G4733 Obstructive sleep apnea (adult) (pediatric): Secondary | ICD-10-CM | POA: Diagnosis not present

## 2015-09-01 DIAGNOSIS — M47812 Spondylosis without myelopathy or radiculopathy, cervical region: Secondary | ICD-10-CM | POA: Diagnosis not present

## 2015-10-05 DIAGNOSIS — I129 Hypertensive chronic kidney disease with stage 1 through stage 4 chronic kidney disease, or unspecified chronic kidney disease: Secondary | ICD-10-CM | POA: Diagnosis not present

## 2015-10-05 DIAGNOSIS — N183 Chronic kidney disease, stage 3 (moderate): Secondary | ICD-10-CM | POA: Diagnosis not present

## 2015-10-05 DIAGNOSIS — E038 Other specified hypothyroidism: Secondary | ICD-10-CM | POA: Diagnosis not present

## 2015-10-05 DIAGNOSIS — G5600 Carpal tunnel syndrome, unspecified upper limb: Secondary | ICD-10-CM | POA: Diagnosis not present

## 2015-10-05 DIAGNOSIS — E1129 Type 2 diabetes mellitus with other diabetic kidney complication: Secondary | ICD-10-CM | POA: Diagnosis not present

## 2015-10-05 DIAGNOSIS — Z6834 Body mass index (BMI) 34.0-34.9, adult: Secondary | ICD-10-CM | POA: Diagnosis not present

## 2015-11-13 DIAGNOSIS — G5601 Carpal tunnel syndrome, right upper limb: Secondary | ICD-10-CM | POA: Diagnosis not present

## 2015-11-13 DIAGNOSIS — M47812 Spondylosis without myelopathy or radiculopathy, cervical region: Secondary | ICD-10-CM | POA: Diagnosis not present

## 2015-11-13 DIAGNOSIS — M5412 Radiculopathy, cervical region: Secondary | ICD-10-CM | POA: Diagnosis not present

## 2015-12-31 DIAGNOSIS — G5601 Carpal tunnel syndrome, right upper limb: Secondary | ICD-10-CM | POA: Diagnosis not present

## 2016-01-11 DIAGNOSIS — R509 Fever, unspecified: Secondary | ICD-10-CM | POA: Diagnosis not present

## 2016-01-11 DIAGNOSIS — J09X2 Influenza due to identified novel influenza A virus with other respiratory manifestations: Secondary | ICD-10-CM | POA: Diagnosis not present

## 2016-02-02 DIAGNOSIS — I10 Essential (primary) hypertension: Secondary | ICD-10-CM | POA: Diagnosis not present

## 2016-02-02 DIAGNOSIS — R197 Diarrhea, unspecified: Secondary | ICD-10-CM | POA: Diagnosis not present

## 2016-02-02 DIAGNOSIS — Z6832 Body mass index (BMI) 32.0-32.9, adult: Secondary | ICD-10-CM | POA: Diagnosis not present

## 2016-02-02 DIAGNOSIS — K529 Noninfective gastroenteritis and colitis, unspecified: Secondary | ICD-10-CM | POA: Diagnosis not present

## 2016-02-04 DIAGNOSIS — G5602 Carpal tunnel syndrome, left upper limb: Secondary | ICD-10-CM | POA: Diagnosis not present

## 2016-02-18 DIAGNOSIS — E1129 Type 2 diabetes mellitus with other diabetic kidney complication: Secondary | ICD-10-CM | POA: Diagnosis not present

## 2016-02-18 DIAGNOSIS — I129 Hypertensive chronic kidney disease with stage 1 through stage 4 chronic kidney disease, or unspecified chronic kidney disease: Secondary | ICD-10-CM | POA: Diagnosis not present

## 2016-02-18 DIAGNOSIS — Z6833 Body mass index (BMI) 33.0-33.9, adult: Secondary | ICD-10-CM | POA: Diagnosis not present

## 2016-02-18 DIAGNOSIS — N183 Chronic kidney disease, stage 3 (moderate): Secondary | ICD-10-CM | POA: Diagnosis not present

## 2016-02-18 DIAGNOSIS — I10 Essential (primary) hypertension: Secondary | ICD-10-CM | POA: Diagnosis not present

## 2016-03-24 DIAGNOSIS — M4802 Spinal stenosis, cervical region: Secondary | ICD-10-CM | POA: Diagnosis not present

## 2016-03-24 DIAGNOSIS — M5021 Other cervical disc displacement,  high cervical region: Secondary | ICD-10-CM | POA: Diagnosis not present

## 2016-03-24 DIAGNOSIS — M50221 Other cervical disc displacement at C4-C5 level: Secondary | ICD-10-CM | POA: Diagnosis not present

## 2016-04-27 DIAGNOSIS — G5601 Carpal tunnel syndrome, right upper limb: Secondary | ICD-10-CM | POA: Diagnosis not present

## 2016-07-01 DIAGNOSIS — I10 Essential (primary) hypertension: Secondary | ICD-10-CM | POA: Diagnosis not present

## 2016-07-01 DIAGNOSIS — M461 Sacroiliitis, not elsewhere classified: Secondary | ICD-10-CM | POA: Diagnosis not present

## 2016-07-04 ENCOUNTER — Encounter: Payer: Self-pay | Admitting: Internal Medicine

## 2016-07-21 DIAGNOSIS — E784 Other hyperlipidemia: Secondary | ICD-10-CM | POA: Diagnosis not present

## 2016-07-21 DIAGNOSIS — Z125 Encounter for screening for malignant neoplasm of prostate: Secondary | ICD-10-CM | POA: Diagnosis not present

## 2016-07-21 DIAGNOSIS — Z0000 Encounter for general adult medical examination without abnormal findings: Secondary | ICD-10-CM | POA: Diagnosis not present

## 2016-07-21 DIAGNOSIS — I10 Essential (primary) hypertension: Secondary | ICD-10-CM | POA: Diagnosis not present

## 2016-07-21 DIAGNOSIS — E1129 Type 2 diabetes mellitus with other diabetic kidney complication: Secondary | ICD-10-CM | POA: Diagnosis not present

## 2016-07-21 DIAGNOSIS — E038 Other specified hypothyroidism: Secondary | ICD-10-CM | POA: Diagnosis not present

## 2016-07-25 DIAGNOSIS — Z0000 Encounter for general adult medical examination without abnormal findings: Secondary | ICD-10-CM | POA: Diagnosis not present

## 2016-07-25 DIAGNOSIS — E038 Other specified hypothyroidism: Secondary | ICD-10-CM | POA: Diagnosis not present

## 2016-07-25 DIAGNOSIS — Z6832 Body mass index (BMI) 32.0-32.9, adult: Secondary | ICD-10-CM | POA: Diagnosis not present

## 2016-07-25 DIAGNOSIS — E1129 Type 2 diabetes mellitus with other diabetic kidney complication: Secondary | ICD-10-CM | POA: Diagnosis not present

## 2016-07-25 DIAGNOSIS — I129 Hypertensive chronic kidney disease with stage 1 through stage 4 chronic kidney disease, or unspecified chronic kidney disease: Secondary | ICD-10-CM | POA: Diagnosis not present

## 2016-07-25 DIAGNOSIS — I10 Essential (primary) hypertension: Secondary | ICD-10-CM | POA: Diagnosis not present

## 2016-07-25 DIAGNOSIS — Z1389 Encounter for screening for other disorder: Secondary | ICD-10-CM | POA: Diagnosis not present

## 2016-12-26 DIAGNOSIS — E7849 Other hyperlipidemia: Secondary | ICD-10-CM | POA: Diagnosis not present

## 2016-12-26 DIAGNOSIS — Z6830 Body mass index (BMI) 30.0-30.9, adult: Secondary | ICD-10-CM | POA: Diagnosis not present

## 2016-12-26 DIAGNOSIS — I10 Essential (primary) hypertension: Secondary | ICD-10-CM | POA: Diagnosis not present

## 2016-12-26 DIAGNOSIS — J309 Allergic rhinitis, unspecified: Secondary | ICD-10-CM | POA: Diagnosis not present

## 2016-12-26 DIAGNOSIS — E1129 Type 2 diabetes mellitus with other diabetic kidney complication: Secondary | ICD-10-CM | POA: Diagnosis not present

## 2016-12-26 DIAGNOSIS — I129 Hypertensive chronic kidney disease with stage 1 through stage 4 chronic kidney disease, or unspecified chronic kidney disease: Secondary | ICD-10-CM | POA: Diagnosis not present

## 2017-01-12 DIAGNOSIS — M47816 Spondylosis without myelopathy or radiculopathy, lumbar region: Secondary | ICD-10-CM | POA: Diagnosis not present

## 2017-01-12 DIAGNOSIS — R29898 Other symptoms and signs involving the musculoskeletal system: Secondary | ICD-10-CM | POA: Diagnosis not present

## 2017-01-12 DIAGNOSIS — M4802 Spinal stenosis, cervical region: Secondary | ICD-10-CM | POA: Diagnosis not present

## 2017-01-12 DIAGNOSIS — M25511 Pain in right shoulder: Secondary | ICD-10-CM | POA: Diagnosis not present

## 2017-01-12 DIAGNOSIS — M47812 Spondylosis without myelopathy or radiculopathy, cervical region: Secondary | ICD-10-CM | POA: Diagnosis not present

## 2017-01-12 DIAGNOSIS — M5412 Radiculopathy, cervical region: Secondary | ICD-10-CM | POA: Diagnosis not present

## 2017-01-12 DIAGNOSIS — M961 Postlaminectomy syndrome, not elsewhere classified: Secondary | ICD-10-CM | POA: Diagnosis not present

## 2017-01-25 DIAGNOSIS — M25511 Pain in right shoulder: Secondary | ICD-10-CM | POA: Diagnosis not present

## 2017-01-25 DIAGNOSIS — M19011 Primary osteoarthritis, right shoulder: Secondary | ICD-10-CM | POA: Diagnosis not present

## 2017-01-25 DIAGNOSIS — M47816 Spondylosis without myelopathy or radiculopathy, lumbar region: Secondary | ICD-10-CM | POA: Diagnosis not present

## 2017-02-16 DIAGNOSIS — M75111 Incomplete rotator cuff tear or rupture of right shoulder, not specified as traumatic: Secondary | ICD-10-CM | POA: Diagnosis not present

## 2017-02-22 DIAGNOSIS — M461 Sacroiliitis, not elsewhere classified: Secondary | ICD-10-CM | POA: Diagnosis not present

## 2017-02-22 DIAGNOSIS — I10 Essential (primary) hypertension: Secondary | ICD-10-CM | POA: Diagnosis not present

## 2017-02-22 DIAGNOSIS — Z6833 Body mass index (BMI) 33.0-33.9, adult: Secondary | ICD-10-CM | POA: Diagnosis not present

## 2017-03-03 DIAGNOSIS — M7541 Impingement syndrome of right shoulder: Secondary | ICD-10-CM | POA: Diagnosis not present

## 2017-03-03 DIAGNOSIS — G8918 Other acute postprocedural pain: Secondary | ICD-10-CM | POA: Diagnosis not present

## 2017-03-03 DIAGNOSIS — M24111 Other articular cartilage disorders, right shoulder: Secondary | ICD-10-CM | POA: Diagnosis not present

## 2017-03-03 DIAGNOSIS — M19011 Primary osteoarthritis, right shoulder: Secondary | ICD-10-CM | POA: Diagnosis not present

## 2017-03-03 DIAGNOSIS — M75111 Incomplete rotator cuff tear or rupture of right shoulder, not specified as traumatic: Secondary | ICD-10-CM | POA: Diagnosis not present

## 2017-03-03 DIAGNOSIS — M94211 Chondromalacia, right shoulder: Secondary | ICD-10-CM | POA: Diagnosis not present

## 2017-03-09 DIAGNOSIS — M25511 Pain in right shoulder: Secondary | ICD-10-CM | POA: Diagnosis not present

## 2017-03-09 DIAGNOSIS — M25611 Stiffness of right shoulder, not elsewhere classified: Secondary | ICD-10-CM | POA: Diagnosis not present

## 2017-03-09 DIAGNOSIS — G8929 Other chronic pain: Secondary | ICD-10-CM | POA: Diagnosis not present

## 2017-03-09 DIAGNOSIS — Z9889 Other specified postprocedural states: Secondary | ICD-10-CM | POA: Diagnosis not present

## 2017-03-15 DIAGNOSIS — M25611 Stiffness of right shoulder, not elsewhere classified: Secondary | ICD-10-CM | POA: Diagnosis not present

## 2017-03-15 DIAGNOSIS — G8929 Other chronic pain: Secondary | ICD-10-CM | POA: Diagnosis not present

## 2017-03-15 DIAGNOSIS — Z9889 Other specified postprocedural states: Secondary | ICD-10-CM | POA: Diagnosis not present

## 2017-03-15 DIAGNOSIS — M25511 Pain in right shoulder: Secondary | ICD-10-CM | POA: Diagnosis not present

## 2017-03-20 DIAGNOSIS — R29898 Other symptoms and signs involving the musculoskeletal system: Secondary | ICD-10-CM | POA: Diagnosis not present

## 2017-03-20 DIAGNOSIS — M25511 Pain in right shoulder: Secondary | ICD-10-CM | POA: Diagnosis not present

## 2017-03-20 DIAGNOSIS — M25611 Stiffness of right shoulder, not elsewhere classified: Secondary | ICD-10-CM | POA: Diagnosis not present

## 2017-03-20 DIAGNOSIS — G8929 Other chronic pain: Secondary | ICD-10-CM | POA: Diagnosis not present

## 2017-03-20 DIAGNOSIS — Z9889 Other specified postprocedural states: Secondary | ICD-10-CM | POA: Diagnosis not present

## 2017-03-27 DIAGNOSIS — M25511 Pain in right shoulder: Secondary | ICD-10-CM | POA: Diagnosis not present

## 2017-03-27 DIAGNOSIS — Z9889 Other specified postprocedural states: Secondary | ICD-10-CM | POA: Diagnosis not present

## 2017-03-27 DIAGNOSIS — R29898 Other symptoms and signs involving the musculoskeletal system: Secondary | ICD-10-CM | POA: Diagnosis not present

## 2017-03-27 DIAGNOSIS — G8929 Other chronic pain: Secondary | ICD-10-CM | POA: Diagnosis not present

## 2017-03-27 DIAGNOSIS — M25611 Stiffness of right shoulder, not elsewhere classified: Secondary | ICD-10-CM | POA: Diagnosis not present

## 2017-04-12 DIAGNOSIS — R29898 Other symptoms and signs involving the musculoskeletal system: Secondary | ICD-10-CM | POA: Diagnosis not present

## 2017-04-12 DIAGNOSIS — M25511 Pain in right shoulder: Secondary | ICD-10-CM | POA: Diagnosis not present

## 2017-04-12 DIAGNOSIS — Z9889 Other specified postprocedural states: Secondary | ICD-10-CM | POA: Diagnosis not present

## 2017-04-12 DIAGNOSIS — M25611 Stiffness of right shoulder, not elsewhere classified: Secondary | ICD-10-CM | POA: Diagnosis not present

## 2017-04-12 DIAGNOSIS — G8929 Other chronic pain: Secondary | ICD-10-CM | POA: Diagnosis not present

## 2017-04-25 DIAGNOSIS — Z9889 Other specified postprocedural states: Secondary | ICD-10-CM | POA: Diagnosis not present

## 2017-04-25 DIAGNOSIS — M25511 Pain in right shoulder: Secondary | ICD-10-CM | POA: Diagnosis not present

## 2017-04-25 DIAGNOSIS — G8929 Other chronic pain: Secondary | ICD-10-CM | POA: Diagnosis not present

## 2017-04-25 DIAGNOSIS — M75111 Incomplete rotator cuff tear or rupture of right shoulder, not specified as traumatic: Secondary | ICD-10-CM | POA: Diagnosis not present

## 2017-05-08 DIAGNOSIS — F329 Major depressive disorder, single episode, unspecified: Secondary | ICD-10-CM | POA: Diagnosis not present

## 2017-05-08 DIAGNOSIS — E669 Obesity, unspecified: Secondary | ICD-10-CM | POA: Diagnosis not present

## 2017-05-08 DIAGNOSIS — G5600 Carpal tunnel syndrome, unspecified upper limb: Secondary | ICD-10-CM | POA: Diagnosis not present

## 2017-05-08 DIAGNOSIS — J309 Allergic rhinitis, unspecified: Secondary | ICD-10-CM | POA: Diagnosis not present

## 2017-05-08 DIAGNOSIS — I129 Hypertensive chronic kidney disease with stage 1 through stage 4 chronic kidney disease, or unspecified chronic kidney disease: Secondary | ICD-10-CM | POA: Diagnosis not present

## 2017-05-08 DIAGNOSIS — Z1331 Encounter for screening for depression: Secondary | ICD-10-CM | POA: Diagnosis not present

## 2017-05-08 DIAGNOSIS — N183 Chronic kidney disease, stage 3 (moderate): Secondary | ICD-10-CM | POA: Diagnosis not present

## 2017-05-08 DIAGNOSIS — E291 Testicular hypofunction: Secondary | ICD-10-CM | POA: Diagnosis not present

## 2017-05-08 DIAGNOSIS — G4730 Sleep apnea, unspecified: Secondary | ICD-10-CM | POA: Diagnosis not present

## 2017-05-08 DIAGNOSIS — E7849 Other hyperlipidemia: Secondary | ICD-10-CM | POA: Diagnosis not present

## 2017-05-08 DIAGNOSIS — E1129 Type 2 diabetes mellitus with other diabetic kidney complication: Secondary | ICD-10-CM | POA: Diagnosis not present

## 2017-05-08 DIAGNOSIS — M5489 Other dorsalgia: Secondary | ICD-10-CM | POA: Diagnosis not present

## 2017-05-15 DIAGNOSIS — M25611 Stiffness of right shoulder, not elsewhere classified: Secondary | ICD-10-CM | POA: Diagnosis not present

## 2017-05-15 DIAGNOSIS — Z9889 Other specified postprocedural states: Secondary | ICD-10-CM | POA: Diagnosis not present

## 2017-05-15 DIAGNOSIS — M25511 Pain in right shoulder: Secondary | ICD-10-CM | POA: Diagnosis not present

## 2017-05-15 DIAGNOSIS — G8929 Other chronic pain: Secondary | ICD-10-CM | POA: Diagnosis not present

## 2017-05-15 DIAGNOSIS — R29898 Other symptoms and signs involving the musculoskeletal system: Secondary | ICD-10-CM | POA: Diagnosis not present

## 2017-09-07 DIAGNOSIS — Z6831 Body mass index (BMI) 31.0-31.9, adult: Secondary | ICD-10-CM | POA: Diagnosis not present

## 2017-09-07 DIAGNOSIS — I10 Essential (primary) hypertension: Secondary | ICD-10-CM | POA: Diagnosis not present

## 2017-09-07 DIAGNOSIS — G5600 Carpal tunnel syndrome, unspecified upper limb: Secondary | ICD-10-CM | POA: Diagnosis not present

## 2017-09-07 DIAGNOSIS — E038 Other specified hypothyroidism: Secondary | ICD-10-CM | POA: Diagnosis not present

## 2017-09-07 DIAGNOSIS — M5489 Other dorsalgia: Secondary | ICD-10-CM | POA: Diagnosis not present

## 2017-09-07 DIAGNOSIS — E1129 Type 2 diabetes mellitus with other diabetic kidney complication: Secondary | ICD-10-CM | POA: Diagnosis not present

## 2017-09-07 DIAGNOSIS — I129 Hypertensive chronic kidney disease with stage 1 through stage 4 chronic kidney disease, or unspecified chronic kidney disease: Secondary | ICD-10-CM | POA: Diagnosis not present

## 2017-09-07 DIAGNOSIS — G4739 Other sleep apnea: Secondary | ICD-10-CM | POA: Diagnosis not present

## 2017-09-07 DIAGNOSIS — E291 Testicular hypofunction: Secondary | ICD-10-CM | POA: Diagnosis not present

## 2017-09-07 DIAGNOSIS — N183 Chronic kidney disease, stage 3 (moderate): Secondary | ICD-10-CM | POA: Diagnosis not present

## 2017-09-07 DIAGNOSIS — E7849 Other hyperlipidemia: Secondary | ICD-10-CM | POA: Diagnosis not present

## 2017-09-07 DIAGNOSIS — G478 Other sleep disorders: Secondary | ICD-10-CM | POA: Diagnosis not present

## 2017-11-16 ENCOUNTER — Emergency Department (HOSPITAL_COMMUNITY)
Admission: EM | Admit: 2017-11-16 | Discharge: 2017-11-16 | Disposition: A | Discharge status: Home / Self Care | Payer: BLUE CROSS/BLUE SHIELD | Attending: Emergency Medicine | Admitting: Emergency Medicine

## 2017-11-16 ENCOUNTER — Encounter (HOSPITAL_COMMUNITY): Payer: Self-pay | Admitting: *Deleted

## 2017-11-16 ENCOUNTER — Emergency Department (HOSPITAL_COMMUNITY): Payer: BLUE CROSS/BLUE SHIELD

## 2017-11-16 DIAGNOSIS — N201 Calculus of ureter: Secondary | ICD-10-CM | POA: Diagnosis not present

## 2017-11-16 DIAGNOSIS — Z79899 Other long term (current) drug therapy: Secondary | ICD-10-CM | POA: Diagnosis not present

## 2017-11-16 DIAGNOSIS — R112 Nausea with vomiting, unspecified: Secondary | ICD-10-CM | POA: Diagnosis not present

## 2017-11-16 DIAGNOSIS — I10 Essential (primary) hypertension: Secondary | ICD-10-CM | POA: Diagnosis not present

## 2017-11-16 DIAGNOSIS — Z7984 Long term (current) use of oral hypoglycemic drugs: Secondary | ICD-10-CM | POA: Insufficient documentation

## 2017-11-16 DIAGNOSIS — E119 Type 2 diabetes mellitus without complications: Secondary | ICD-10-CM | POA: Insufficient documentation

## 2017-11-16 DIAGNOSIS — N200 Calculus of kidney: Secondary | ICD-10-CM | POA: Diagnosis not present

## 2017-11-16 DIAGNOSIS — R1032 Left lower quadrant pain: Secondary | ICD-10-CM | POA: Diagnosis not present

## 2017-11-16 DIAGNOSIS — R7989 Other specified abnormal findings of blood chemistry: Secondary | ICD-10-CM | POA: Diagnosis not present

## 2017-11-16 DIAGNOSIS — Z87891 Personal history of nicotine dependence: Secondary | ICD-10-CM | POA: Diagnosis not present

## 2017-11-16 DIAGNOSIS — R748 Abnormal levels of other serum enzymes: Secondary | ICD-10-CM | POA: Insufficient documentation

## 2017-11-16 LAB — COMPREHENSIVE METABOLIC PANEL
ALBUMIN: 4.3 g/dL (ref 3.5–5.0)
ALT: 21 U/L (ref 0–44)
AST: 20 U/L (ref 15–41)
Alkaline Phosphatase: 68 U/L (ref 38–126)
Anion gap: 9 (ref 5–15)
BUN: 20 mg/dL (ref 6–20)
CHLORIDE: 103 mmol/L (ref 98–111)
CO2: 27 mmol/L (ref 22–32)
CREATININE: 1.47 mg/dL — AB (ref 0.61–1.24)
Calcium: 9.3 mg/dL (ref 8.9–10.3)
GFR calc Af Amer: 59 mL/min — ABNORMAL LOW (ref >60)
GFR calc non Af Amer: 51 mL/min — ABNORMAL LOW (ref >60)
GLUCOSE: 146 mg/dL — AB (ref 70–99)
POTASSIUM: 4.6 mmol/L (ref 3.5–5.1)
Sodium: 139 mmol/L (ref 135–145)
Total Bilirubin: 1.6 mg/dL — ABNORMAL HIGH (ref 0.3–1.2)
Total Protein: 6.9 g/dL (ref 6.5–8.1)

## 2017-11-16 LAB — URINALYSIS, ROUTINE W REFLEX MICROSCOPIC
Bacteria, UA: NONE SEEN
Bilirubin Urine: NEGATIVE
GLUCOSE, UA: NEGATIVE mg/dL
KETONES UR: NEGATIVE mg/dL
LEUKOCYTES UA: NEGATIVE
NITRITE: NEGATIVE
PH: 5 (ref 5.0–8.0)
Protein, ur: 30 mg/dL — AB
Specific Gravity, Urine: 1.03 (ref 1.005–1.030)

## 2017-11-16 LAB — CBC
HEMATOCRIT: 49.4 % (ref 39.0–52.0)
Hemoglobin: 16.5 g/dL (ref 13.0–17.0)
MCH: 35.3 pg — ABNORMAL HIGH (ref 26.0–34.0)
MCHC: 33.4 g/dL (ref 30.0–36.0)
MCV: 105.8 fL — AB (ref 78.0–100.0)
PLATELETS: 183 10*3/uL (ref 150–400)
RBC: 4.67 MIL/uL (ref 4.22–5.81)
RDW: 13 % (ref 11.5–15.5)
WBC: 6.4 10*3/uL (ref 4.0–10.5)

## 2017-11-16 LAB — LIPASE, BLOOD: LIPASE: 283 U/L — AB (ref 11–51)

## 2017-11-16 MED ORDER — KETOROLAC TROMETHAMINE 30 MG/ML IJ SOLN
30.0000 mg | Freq: Once | INTRAMUSCULAR | Status: AC
  Administered 2017-11-16: 30 mg via INTRAVENOUS
  Filled 2017-11-16: qty 1

## 2017-11-16 MED ORDER — IOPAMIDOL (ISOVUE-300) INJECTION 61%
INTRAVENOUS | Status: AC
  Filled 2017-11-16: qty 100

## 2017-11-16 MED ORDER — SODIUM CHLORIDE 0.9 % IV BOLUS
1000.0000 mL | Freq: Once | INTRAVENOUS | Status: AC
  Administered 2017-11-16: 1000 mL via INTRAVENOUS

## 2017-11-16 MED ORDER — PROMETHAZINE HCL 25 MG/ML IJ SOLN
25.0000 mg | Freq: Once | INTRAMUSCULAR | Status: AC
  Administered 2017-11-16: 25 mg via INTRAVENOUS
  Filled 2017-11-16: qty 1

## 2017-11-16 MED ORDER — IBUPROFEN 600 MG PO TABS: 600.0000 mg | ORAL_TABLET | Freq: Four times a day (QID) | ORAL | 0 refills | Status: DC | PRN

## 2017-11-16 MED ORDER — MORPHINE SULFATE (PF) 4 MG/ML IV SOLN
4.0000 mg | Freq: Once | INTRAVENOUS | Status: AC
  Administered 2017-11-16: 4 mg via INTRAVENOUS
  Filled 2017-11-16: qty 1

## 2017-11-16 MED ORDER — ONDANSETRON 4 MG PO TBDP: 4.0000 mg | ORAL_TABLET | Freq: Three times a day (TID) | ORAL | 0 refills | Status: AC | PRN

## 2017-11-16 MED ORDER — ONDANSETRON 4 MG PO TBDP
4.0000 mg | ORAL_TABLET | Freq: Once | ORAL | Status: AC | PRN
  Administered 2017-11-16: 4 mg via ORAL
  Filled 2017-11-16: qty 1

## 2017-11-16 MED ORDER — OXYCODONE HCL 5 MG PO TABS: 5.0000 mg | ORAL_TABLET | Freq: Four times a day (QID) | ORAL | 0 refills | Status: DC | PRN

## 2017-11-16 MED ORDER — ONDANSETRON HCL 4 MG/2ML IJ SOLN
4.0000 mg | Freq: Once | INTRAMUSCULAR | Status: AC
  Administered 2017-11-16: 4 mg via INTRAVENOUS
  Filled 2017-11-16: qty 2

## 2017-11-16 MED ORDER — IOPAMIDOL (ISOVUE-300) INJECTION 61%
100.0000 mL | Freq: Once | INTRAVENOUS | Status: AC
  Administered 2017-11-16: 100 mL via INTRAVENOUS

## 2017-11-16 MED ORDER — SODIUM CHLORIDE 0.9 % IV BOLUS
250.0000 mL | Freq: Once | INTRAVENOUS | Status: AC
  Administered 2017-11-16: 250 mL via INTRAVENOUS

## 2017-11-16 MED ORDER — ACETAMINOPHEN 500 MG PO TABS: 500.0000 mg | ORAL_TABLET | Freq: Four times a day (QID) | ORAL | 0 refills | Status: DC | PRN

## 2017-11-16 NOTE — Discharge Instructions (Addendum)
You can take 400 to 600 mg of ibuprofen every 6 hours as needed for pain.  You can also take 500 to 1000 mg of Tylenol every 6 hours for pain.  You can take oxycodone as needed for severe pain but do not drive, drink alcohol, or operate heavy machinery while taking this medicine as it may make you drowsy.  Take Zofran as needed for nausea, wait around 20 to 30 minutes before having anything to eat or drink to give this medication time to work.  Drink plenty water and get plenty of rest.  Follow-up with a urologist for reevaluation of your kidney stones and your kidney function.  You had an elevated lipase today which could indicate a problem with your pancreas.  Follow-up with your primary care physician for reevaluation.  They can also monitor your kidney function. Return to the emergency department if any concerning signs or symptoms develop such as fevers, worsening pain, or persistent vomiting and inability to keep any food or drink down.  If your blood pressure (BP) was elevated on multiple readings during this visit above 130 for the top number or above 80 for the bottom number, please have this repeated by your primary care provider within one month. You can also check your blood pressure when you are out at a pharmacy or grocery store. Many have machines that will check your blood pressure.  If your blood pressure remains elevated, please follow-up with your PCP.

## 2017-11-16 NOTE — ED Notes (Signed)
EDP made aware of pts pain and vomiting

## 2017-11-16 NOTE — ED Notes (Signed)
Patient transported to CT 

## 2017-11-16 NOTE — ED Triage Notes (Signed)
Pt in c/o LLQ abdominal pain and n/v since last night, history of kidney stones and diverticulitis, denies diarrhea or urinary symptoms, pt actively vomiting in triage

## 2017-11-16 NOTE — ED Provider Notes (Signed)
[facility] EMERGENCY DEPARTMENT Provider Note   CSN: [id number] Arrival date & time: 11/16/17  4098     History   Chief Complaint Chief Complaint  Patient presents with  . Abdominal Pain    HPI Kevin Olsen is a 57 y.o. male with history of diabetes mellitus, hypertension, hyperlipidemia, nephrolithiasis,DISH, and diverticulitis presents for evaluation of acute onset, progressively worsening left lower quadrant abdominal pain since yesterday evening.  He states pain is constant, aching and sharp, does not radiate.  No aggravating or alleviating factors noted.  He had one episode of nonbloody nonbilious emesis beginning today.  Denies diarrhea, constipation, melena, hematochezia, urinary frequency, urgency, dysuria, or hematuria.  Denies fevers or chills.  No chest pain or shortness of breath.  Endorses occasional CBD use, denies any additional drug use.  States he drinks alcohol rarely.  The history is provided by the patient.    Past Medical History:  Diagnosis Date  . Abdominal pain    lower abd pain  . AC (acromioclavicular) joint bone spurs   . Central apnea   . Diabetes mellitus without complication (HCC)   . Esophageal pain   . History of kidney stones   . Hypertension   . Neck pain    neck and back pain    There are no active problems to display for this patient.   Past Surgical History:  Procedure Laterality Date  . back fusion     with screws and bolts  . COLONOSCOPY    . ELBOW SURGERY     left  . KNEE SURGERY     right knee  . NASAL FRACTURE SURGERY          Home Medications    Prior to Admission medications   Medication Sig Start Date End Date Taking? Authorizing Provider  benazepril (LOTENSIN) 10 MG tablet Take 10 mg by mouth See admin instructions. Take 10 mg by mouth at bedtime and may also take an additional 10 mg once a day as needed for elevated B/P   Yes [provider]  cyclobenzaprine (FLEXERIL) 10 MG  tablet Take 10 mg by mouth See admin instructions. Take 10 mg by mouth at bedtime and an additional 10 mg two times a day as needed for spasms   Yes [provider]  levothyroxine (SYNTHROID) 88 MCG tablet Take 88 mcg by mouth daily.   Yes [provider]  LORazepam (ATIVAN) 1 MG tablet Take 1 tablet (1 mg total) by mouth 3 (three) times daily as needed for anxiety (or itching). 09/16/12  Yes Dione Booze, MD  metaxalone (SKELAXIN) 800 MG tablet Take 800 mg by mouth 3 (three) times daily as needed for muscle spasms.  10/23/17  Yes [provider]  metFORMIN (GLUCOPHAGE) 500 MG tablet Take 500 mg by mouth at bedtime.    Yes [provider]  acetaminophen (TYLENOL) 500 MG tablet Take 1 tablet (500 mg total) by mouth every 6 (six) hours as needed. 11/16/17   Chloris Marcoux A, PA-C  ibuprofen (ADVIL,MOTRIN) 600 MG tablet Take 1 tablet (600 mg total) by mouth every 6 (six) hours as needed. 11/16/17   Luevenia Maxin, Hunner Garcon A, PA-C  ondansetron (ZOFRAN ODT) 4 MG disintegrating tablet Take 1 tablet (4 mg total) by mouth every 8 (eight) hours as needed for up to 3 days for nausea or vomiting. 11/16/17 11/19/17  Michela Pitcher A, PA-C  oxyCODONE (ROXICODONE) 5 MG immediate release tablet Take 1 tablet (5 mg total) by  mouth every 6 (six) hours as needed for severe pain. 11/16/17   Luevenia Maxin, Radwan Cowley A, PA-C  predniSONE (DELTASONE) 50 MG tablet Take 1 tablet (50 mg total) by mouth daily. Patient not taking: Reported on 11/16/2017 09/16/12   Dione Booze, MD    Family History Family History  Problem Relation Age of Onset  . Prostate cancer Father     Social History Social History   Tobacco Use  . Smoking status: Former Smoker    Years: 10.00    Types: Cigarettes    Last attempt to quit: 08/23/1983    Years since quitting: 34.2  . Smokeless tobacco: Never Used  Substance Use Topics  . Alcohol use: No  . Drug use: No     Allergies   Patient has no known allergies.   Review of Systems Review  of Systems  Constitutional: Negative for chills and fever.  Respiratory: Negative for shortness of breath.   Cardiovascular: Negative for chest pain.  Gastrointestinal: Positive for abdominal pain, nausea and vomiting. Negative for constipation and diarrhea.  Genitourinary: Negative for dysuria, frequency, hematuria and urgency.  All other systems reviewed and are negative.    Physical Exam Updated Vital Signs BP 113/68   Pulse 93   Temp (!) 97.5 F (36.4 C) (Oral)   Resp 18   SpO2 92%   Physical Exam  Constitutional: He appears well-developed and well-nourished. No distress.  Appears uncomfortable  HENT:  Head: Normocephalic and atraumatic.  Eyes: Conjunctivae are normal. Right eye exhibits no discharge. Left eye exhibits no discharge.  Neck: No JVD present. No tracheal deviation present.  Cardiovascular: Regular rhythm and normal heart sounds.  Pulmonary/Chest: Effort normal and breath sounds normal.  Abdominal: Soft. He exhibits no distension. Bowel sounds are decreased. There is tenderness in the left lower quadrant. There is guarding. There is no rigidity, no rebound, no CVA tenderness, no tenderness at McBurney's point and negative Murphy's sign.  Abdomen obese  Musculoskeletal: He exhibits no edema.  No paralumbar muscle tenderness  Neurological: He is alert.  Skin: Skin is warm and dry. No erythema.  Psychiatric: He has a normal mood and affect. His behavior is normal.  Nursing note and vitals reviewed.    ED Treatments / Results  Labs (all labs ordered are listed, but only abnormal results are displayed) Labs Reviewed  LIPASE, BLOOD - Abnormal; Notable for the following components:      Result Value   Lipase 283 (*)    All other components within normal limits  COMPREHENSIVE METABOLIC PANEL - Abnormal; Notable for the following components:   Glucose, Bld 146 (*)    Creatinine, Ser 1.47 (*)    Total Bilirubin 1.6 (*)    GFR calc non Af Amer 51 (*)    GFR  calc Af Amer 59 (*)    All other components within normal limits  CBC - Abnormal; Notable for the following components:   MCV 105.8 (*)    MCH 35.3 (*)    All other components within normal limits  URINALYSIS, ROUTINE W REFLEX MICROSCOPIC - Abnormal; Notable for the following components:   Hgb urine dipstick LARGE (*)    Protein, ur 30 (*)    All other components within normal limits    EKG None  Radiology Ct Abdomen Pelvis W Contrast  Result Date: 11/16/2017 CLINICAL DATA:  57 year old male with a history of left lower quadrant pain and nausea and vomiting EXAM: CT ABDOMEN AND PELVIS WITH CONTRAST TECHNIQUE: Multidetector CT  imaging of the abdomen and pelvis was performed using the standard protocol following bolus administration of intravenous contrast. CONTRAST:  ISOVUE-300 IOPAMIDOL (ISOVUE-300) INJECTION 61% COMPARISON:  03/05/2010 FINDINGS: Lower chest: No acute finding of the lower chest. Hepatobiliary: Unremarkable liver.  Unremarkable gallbladder. Pancreas: Unremarkable pancreas Spleen: Unremarkable spleen Adrenals/Urinary Tract: Unremarkable bilateral adrenal glands. Right kidney with 2 small nonobstructing stones in the collecting system. No hydronephrosis. No right-sided perinephric stranding. Nonenhancing low-density cystic structure of the superior medial cortex of the right kidney compatible with Bosniak 1 cyst. Unremarkable course the right ureter. Left kidney demonstrates mild perinephric stranding with inflammatory changes at the renal hilum in the region of the parapelvic cysts. No hydronephrosis. Redemonstration of cyst on the posterior cortex of the left kidney which has enlarged from the comparison CT now measuring 5.6 cm. Mild inflammatory changes adjacent to the proximal left ureter which is nondistended. There is a left sided ureterovesical stone at the base of the bladder which measures 3 mm. Nonobstructive stone at the inferior left collecting system of the kidney.  Stomach/Bowel: Unremarkable stomach. Unremarkable small bowel without abnormal distention. No transition point no focal wall thickening. Normal appendix. Mild stool burden. Colonic diverticular disease without evidence of acute inflammation. Vascular/Lymphatic: Atherosclerotic calcifications of the abdominal aorta. No aneurysm. No periaortic fluid. Mild atherosclerosis of the iliac arteries. No adenopathy. Small lymph nodes in the periaortic nodal stations, which were present on the prior. Reproductive: Transverse diameter prostate measures 3.5 cm. Other: Small fat containing umbilical hernia. Left sided inguinal hernia contains fat. Musculoskeletal: No acute displaced fracture. Surgical changes of prior posterior lumbar interbody fusion with bilateral pedicle screw and rod fixation of L5-S1. Degenerative changes of the hips. IMPRESSION: Small stone at the left ureterovesical junction, without evidence of hydronephrosis. There is inflammatory change at the left collecting system and perinephric space. This may be reactive/inflammatory, however, if there is concern for ascending urinary tract infection, recommend correlation with urinalysis. Additional bilateral small nonobstructing nephrolithiasis. Diverticular disease without evidence of acute diverticulitis. Aortic Atherosclerosis (ICD10-I70.0). Electronically Signed   By: Gilmer Mor D.O.   On: 11/16/2017 13:54    Procedures Procedures (including critical care time)  Medications Ordered in ED Medications  iopamidol (ISOVUE-300) 61 % injection (has no administration in time range)  ondansetron (ZOFRAN-ODT) disintegrating tablet 4 mg (4 mg Oral Given 11/16/17 1008)  morphine 4 MG/ML injection 4 mg (4 mg Intravenous Given 11/16/17 1143)  ondansetron (ZOFRAN) injection 4 mg (4 mg Intravenous Given 11/16/17 1141)  sodium chloride 0.9 % bolus 1,000 mL (0 mLs Intravenous Stopped 11/16/17 1259)  iopamidol (ISOVUE-300) 61 % injection 100 mL (100 mLs Intravenous  Contrast Given 11/16/17 1311)  promethazine (PHENERGAN) injection 25 mg (25 mg Intravenous Given 11/16/17 1414)  morphine 4 MG/ML injection 4 mg (4 mg Intravenous Given 11/16/17 1417)  sodium chloride 0.9 % bolus 250 mL (0 mLs Intravenous Stopped 11/16/17 1508)  ketorolac (TORADOL) 30 MG/ML injection 30 mg (30 mg Intravenous Given 11/16/17 1421)     Initial Impression / Assessment and Plan / ED Course  I have reviewed the triage vital signs and the nursing notes.  Pertinent labs & imaging results that were available during my care of the patient were reviewed by me and considered in my medical decision making (see chart for details).     Patient with acute onset of left lower quadrant abdominal pain since yesterday and nausea and vomiting beginning today.  He is afebrile, tachycardic and hypertensive initially with complete resolution on reevaluation  after ministration of pain medicine and fluids. Did not take his BP medication today.  No peritoneal signs on examination of the abdomen.  Lab work reviewed by me shows no leukocytosis, no anemia.  Creatinine is somewhat elevated at 1.47 although I have no recent lab work to compare to.  Total bilirubin is elevated at 1.6 although it appears it has been more elevated in the past.  Interestingly, he has an elevated lipase of 283 though has no epigastric abdominal pain.  He denies excessive alcohol intake and his last hemoglobin A1c a few months ago was around 6.  Unsure of clinical significance however does not appear to be related to acute pancreatitis.  UA shows gross hematuria, no evidence of UTI.  CT of the abdomen and pelvis shows a small stone at the left UVJ without evidence of hydronephrosis.  There are also inflammatory changes of the left collecting system and perinephric space.  No evidence of abscess.  No evidence of pyelonephritis.  No evidence of diverticulitis, appendicitis, colitis, obstruction, or perforation.  No other acute surgical abdominal  pathology noted.  Patient was given IV fluids, antiemetics, and pain medicine in the ED and on reevaluation he is resting comfortably in no apparent distress.  He states he is feeling much better.  He is tolerating p.o. fluids in the ED without difficulty and serial abdominal examinations remain benign.  I informed him of the abnormalities in his lab work and imaging, recommend follow-up with PCP for reevaluation of his elevated lipase and creatinine.  Recommend follow-up with urology for reevaluation of his kidney stones.  Will discharge with Zofran and oxycodone.  Discussed strict ED return precautions. Pt verbalized understanding of and agreement with plan and is safe for discharge home at this time.   Final Clinical Impressions(s) / ED Diagnoses   Final diagnoses:  Ureterolithiasis  Elevated serum creatinine  Elevated lipase  Hypertension, unspecified type    ED Discharge Orders         Ordered    ondansetron (ZOFRAN ODT) 4 MG disintegrating tablet  Every 8 hours PRN     11/16/17 1608    oxyCODONE (ROXICODONE) 5 MG immediate release tablet  Every 6 hours PRN     11/16/17 1608    acetaminophen (TYLENOL) 500 MG tablet  Every 6 hours PRN     11/16/17 1608    ibuprofen (ADVIL,MOTRIN) 600 MG tablet  Every 6 hours PRN     11/16/17 1608           Michela PitcherFawze, Tuyen Uncapher A, PA-C 11/16/17 1733    Melene PlanFloyd, Dan, DO 11/18/17 [phone]

## 2017-12-08 DIAGNOSIS — R1084 Generalized abdominal pain: Secondary | ICD-10-CM | POA: Diagnosis not present

## 2017-12-08 DIAGNOSIS — N200 Calculus of kidney: Secondary | ICD-10-CM | POA: Diagnosis not present

## 2017-12-08 DIAGNOSIS — N183 Chronic kidney disease, stage 3 (moderate): Secondary | ICD-10-CM | POA: Diagnosis not present

## 2017-12-08 DIAGNOSIS — Z6831 Body mass index (BMI) 31.0-31.9, adult: Secondary | ICD-10-CM | POA: Diagnosis not present

## 2017-12-08 DIAGNOSIS — I10 Essential (primary) hypertension: Secondary | ICD-10-CM | POA: Diagnosis not present

## 2017-12-08 DIAGNOSIS — M5489 Other dorsalgia: Secondary | ICD-10-CM | POA: Diagnosis not present

## 2017-12-11 DIAGNOSIS — N200 Calculus of kidney: Secondary | ICD-10-CM | POA: Diagnosis not present

## 2017-12-11 DIAGNOSIS — R1084 Generalized abdominal pain: Secondary | ICD-10-CM | POA: Diagnosis not present

## 2017-12-11 DIAGNOSIS — N202 Calculus of kidney with calculus of ureter: Secondary | ICD-10-CM | POA: Diagnosis not present

## 2017-12-11 DIAGNOSIS — M545 Low back pain: Secondary | ICD-10-CM | POA: Diagnosis not present

## 2017-12-20 DIAGNOSIS — M461 Sacroiliitis, not elsewhere classified: Secondary | ICD-10-CM | POA: Diagnosis not present

## 2018-02-12 DIAGNOSIS — Z6833 Body mass index (BMI) 33.0-33.9, adult: Secondary | ICD-10-CM | POA: Diagnosis not present

## 2018-02-12 DIAGNOSIS — M961 Postlaminectomy syndrome, not elsewhere classified: Secondary | ICD-10-CM | POA: Diagnosis not present

## 2018-02-12 DIAGNOSIS — M47816 Spondylosis without myelopathy or radiculopathy, lumbar region: Secondary | ICD-10-CM | POA: Diagnosis not present

## 2018-02-12 DIAGNOSIS — M461 Sacroiliitis, not elsewhere classified: Secondary | ICD-10-CM | POA: Diagnosis not present

## 2018-02-12 DIAGNOSIS — I10 Essential (primary) hypertension: Secondary | ICD-10-CM | POA: Diagnosis not present

## 2018-02-20 DIAGNOSIS — I129 Hypertensive chronic kidney disease with stage 1 through stage 4 chronic kidney disease, or unspecified chronic kidney disease: Secondary | ICD-10-CM | POA: Diagnosis not present

## 2018-02-21 DIAGNOSIS — M48061 Spinal stenosis, lumbar region without neurogenic claudication: Secondary | ICD-10-CM | POA: Diagnosis not present

## 2018-02-21 DIAGNOSIS — M961 Postlaminectomy syndrome, not elsewhere classified: Secondary | ICD-10-CM | POA: Diagnosis not present

## 2018-03-07 DIAGNOSIS — Z6832 Body mass index (BMI) 32.0-32.9, adult: Secondary | ICD-10-CM | POA: Diagnosis not present

## 2018-03-07 DIAGNOSIS — M48061 Spinal stenosis, lumbar region without neurogenic claudication: Secondary | ICD-10-CM | POA: Diagnosis not present

## 2018-03-07 DIAGNOSIS — I10 Essential (primary) hypertension: Secondary | ICD-10-CM | POA: Diagnosis not present

## 2018-03-22 DIAGNOSIS — N5201 Erectile dysfunction due to arterial insufficiency: Secondary | ICD-10-CM | POA: Diagnosis not present

## 2018-03-22 DIAGNOSIS — M545 Low back pain: Secondary | ICD-10-CM | POA: Diagnosis not present

## 2018-03-22 DIAGNOSIS — N202 Calculus of kidney with calculus of ureter: Secondary | ICD-10-CM | POA: Diagnosis not present

## 2018-04-04 DIAGNOSIS — Z23 Encounter for immunization: Secondary | ICD-10-CM | POA: Diagnosis not present

## 2018-04-06 DIAGNOSIS — M47812 Spondylosis without myelopathy or radiculopathy, cervical region: Secondary | ICD-10-CM | POA: Diagnosis not present

## 2018-04-06 DIAGNOSIS — M961 Postlaminectomy syndrome, not elsewhere classified: Secondary | ICD-10-CM | POA: Diagnosis not present

## 2018-04-06 DIAGNOSIS — I10 Essential (primary) hypertension: Secondary | ICD-10-CM | POA: Diagnosis not present

## 2018-04-06 DIAGNOSIS — M461 Sacroiliitis, not elsewhere classified: Secondary | ICD-10-CM | POA: Diagnosis not present

## 2018-04-06 DIAGNOSIS — M47816 Spondylosis without myelopathy or radiculopathy, lumbar region: Secondary | ICD-10-CM | POA: Diagnosis not present

## 2018-04-06 DIAGNOSIS — M4802 Spinal stenosis, cervical region: Secondary | ICD-10-CM | POA: Diagnosis not present

## 2018-04-11 DIAGNOSIS — Z6833 Body mass index (BMI) 33.0-33.9, adult: Secondary | ICD-10-CM | POA: Diagnosis not present

## 2018-04-11 DIAGNOSIS — I10 Essential (primary) hypertension: Secondary | ICD-10-CM | POA: Diagnosis not present

## 2018-04-11 DIAGNOSIS — M461 Sacroiliitis, not elsewhere classified: Secondary | ICD-10-CM | POA: Diagnosis not present

## 2018-10-22 ENCOUNTER — Emergency Department (HOSPITAL_COMMUNITY)
Admission: EM | Admit: 2018-10-22 | Discharge: 2018-10-22 | Disposition: A | Discharge status: Home / Self Care | Payer: BC Managed Care – PPO | Attending: Emergency Medicine | Admitting: Emergency Medicine

## 2018-10-22 ENCOUNTER — Encounter (HOSPITAL_COMMUNITY): Payer: Self-pay | Admitting: Emergency Medicine

## 2018-10-22 ENCOUNTER — Emergency Department (HOSPITAL_COMMUNITY): Payer: BC Managed Care – PPO

## 2018-10-22 ENCOUNTER — Other Ambulatory Visit: Payer: Self-pay

## 2018-10-22 DIAGNOSIS — M545 Low back pain, unspecified: Secondary | ICD-10-CM

## 2018-10-22 DIAGNOSIS — I10 Essential (primary) hypertension: Secondary | ICD-10-CM | POA: Diagnosis not present

## 2018-10-22 DIAGNOSIS — Z79899 Other long term (current) drug therapy: Secondary | ICD-10-CM | POA: Insufficient documentation

## 2018-10-22 DIAGNOSIS — E119 Type 2 diabetes mellitus without complications: Secondary | ICD-10-CM | POA: Diagnosis not present

## 2018-10-22 DIAGNOSIS — G8929 Other chronic pain: Secondary | ICD-10-CM | POA: Diagnosis not present

## 2018-10-22 DIAGNOSIS — M5489 Other dorsalgia: Secondary | ICD-10-CM | POA: Diagnosis not present

## 2018-10-22 DIAGNOSIS — Z87891 Personal history of nicotine dependence: Secondary | ICD-10-CM | POA: Insufficient documentation

## 2018-10-22 DIAGNOSIS — R52 Pain, unspecified: Secondary | ICD-10-CM | POA: Diagnosis not present

## 2018-10-22 DIAGNOSIS — M47816 Spondylosis without myelopathy or radiculopathy, lumbar region: Secondary | ICD-10-CM | POA: Diagnosis not present

## 2018-10-22 LAB — CBC WITH DIFFERENTIAL/PLATELET
Abs Immature Granulocytes: 0.03 10*3/uL (ref 0.00–0.07)
Basophils Absolute: 0 10*3/uL (ref 0.0–0.1)
Basophils Relative: 0 %
Eosinophils Absolute: 0.2 10*3/uL (ref 0.0–0.5)
Eosinophils Relative: 2 %
HCT: 49.1 % (ref 39.0–52.0)
Hemoglobin: 16.3 g/dL (ref 13.0–17.0)
Immature Granulocytes: 0 %
Lymphocytes Relative: 18 %
Lymphs Abs: 1.7 10*3/uL (ref 0.7–4.0)
MCH: 34.6 pg — ABNORMAL HIGH (ref 26.0–34.0)
MCHC: 33.2 g/dL (ref 30.0–36.0)
MCV: 104.2 fL — ABNORMAL HIGH (ref 80.0–100.0)
Monocytes Absolute: 1 10*3/uL (ref 0.1–1.0)
Monocytes Relative: 11 %
Neutro Abs: 6.2 10*3/uL (ref 1.7–7.7)
Neutrophils Relative %: 69 %
Platelets: 203 10*3/uL (ref 150–400)
RBC: 4.71 MIL/uL (ref 4.22–5.81)
RDW: 12.3 % (ref 11.5–15.5)
WBC: 9.1 10*3/uL (ref 4.0–10.5)
nRBC: 0 % (ref 0.0–0.2)

## 2018-10-22 LAB — COMPREHENSIVE METABOLIC PANEL
ALT: 18 U/L (ref 0–44)
AST: 19 U/L (ref 15–41)
Albumin: 3.9 g/dL (ref 3.5–5.0)
Alkaline Phosphatase: 67 U/L (ref 38–126)
Anion gap: 9 (ref 5–15)
BUN: 16 mg/dL (ref 6–20)
CO2: 24 mmol/L (ref 22–32)
Calcium: 9 mg/dL (ref 8.9–10.3)
Chloride: 103 mmol/L (ref 98–111)
Creatinine, Ser: 1.28 mg/dL — ABNORMAL HIGH (ref 0.61–1.24)
GFR calc Af Amer: >60 mL/min (ref >60)
GFR calc non Af Amer: >60 mL/min (ref >60)
Glucose, Bld: 140 mg/dL — ABNORMAL HIGH (ref 70–99)
Potassium: 4.1 mmol/L (ref 3.5–5.1)
Sodium: 136 mmol/L (ref 135–145)
Total Bilirubin: 1.8 mg/dL — ABNORMAL HIGH (ref 0.3–1.2)
Total Protein: 6.8 g/dL (ref 6.5–8.1)

## 2018-10-22 LAB — URINALYSIS, ROUTINE W REFLEX MICROSCOPIC
Bacteria, UA: NONE SEEN
Bilirubin Urine: NEGATIVE
Glucose, UA: NEGATIVE mg/dL
Hgb urine dipstick: NEGATIVE
Ketones, ur: NEGATIVE mg/dL
Leukocytes,Ua: NEGATIVE
Nitrite: NEGATIVE
Protein, ur: 30 mg/dL — AB
Specific Gravity, Urine: 1.018 (ref 1.005–1.030)
pH: 5 (ref 5.0–8.0)

## 2018-10-22 MED ORDER — OXYCODONE HCL 5 MG PO TABS: 5.0000 mg | ORAL_TABLET | Freq: Four times a day (QID) | ORAL | 0 refills | Status: DC | PRN

## 2018-10-22 MED ORDER — DIAZEPAM 2 MG PO TABS
2.0000 mg | ORAL_TABLET | Freq: Once | ORAL | Status: AC
  Administered 2018-10-22: 09:00:00 2 mg via ORAL
  Filled 2018-10-22: qty 1

## 2018-10-22 MED ORDER — MORPHINE SULFATE (PF) 2 MG/ML IV SOLN
2.0000 mg | Freq: Once | INTRAVENOUS | Status: AC
  Administered 2018-10-22: 2 mg via INTRAVENOUS
  Filled 2018-10-22: qty 1

## 2018-10-22 MED ORDER — OXYCODONE-ACETAMINOPHEN 5-325 MG PO TABS
1.0000 | ORAL_TABLET | Freq: Once | ORAL | Status: AC
  Administered 2018-10-22: 1 via ORAL
  Filled 2018-10-22: qty 1

## 2018-10-22 MED ORDER — KETOROLAC TROMETHAMINE 30 MG/ML IJ SOLN
30.0000 mg | Freq: Once | INTRAMUSCULAR | Status: AC
  Administered 2018-10-22: 09:00:00 30 mg via INTRAVENOUS
  Filled 2018-10-22: qty 1

## 2018-10-22 NOTE — ED Provider Notes (Signed)
[facility] Provider Note   CSN: [id number] Arrival date & time: 10/22/18  [id number]  History   Chief Complaint Chief Complaint  Patient presents with   Back Pain    HPI Kevin Dienerimothy S Blackham is a 58 y.o. male who presents with around 1 day history of lumbar back pain. Patient states that he has recently been working on a greenhouse and believes that he just overdid it. He states that the pain initially started in his right hip leading for him to change his gait.  The pain is right hip sort of came and went. He awoke early this a.m. with bad left hip pain.  States the pain starts anteriorly and radiates around his hip.  He notes that he does have a history of kidney stone and this pain feels "exactly the same".  He has not noticed any blood in his stool.  He has not taken any medications help aside from Tylenol.  Patient has no alarm symptoms such as urinary/fecal incontinence, loss of sensation, sciatica.  Patient states that he was supposed to have an appointment with Dr. Murray HodgkinsBartko to discuss further spinal work-up as his epidural injections have not been working as well recently.  Patient last MRI done back in 2011.   Past Medical History:  Diagnosis Date   Abdominal pain    lower abd pain   AC (acromioclavicular) joint bone spurs    Central apnea    Diabetes mellitus without complication (HCC)    Esophageal pain    History of kidney stones    Hypertension    Neck pain    neck and back pain    There are no active problems to display for this patient.   Past Surgical History:  Procedure Laterality Date   back fusion     with screws and bolts   COLONOSCOPY     ELBOW SURGERY     left   KNEE SURGERY     right knee   NASAL FRACTURE SURGERY        Home Medications    Prior to Admission medications   Medication Sig Start Date End Date Taking? Authorizing Provider  acetaminophen (TYLENOL) 500 MG tablet Take 1 tablet (500 mg  total) by mouth every 6 (six) hours as needed. 11/16/17   Fawze, Mina A, PA-C  benazepril (LOTENSIN) 10 MG tablet Take 10 mg by mouth See admin instructions. Take 10 mg by mouth at bedtime and may also take an additional 10 mg once a day as needed for elevated B/P    [provider]  cyclobenzaprine (FLEXERIL) 10 MG tablet Take 10 mg by mouth See admin instructions. Take 10 mg by mouth at bedtime and an additional 10 mg two times a day as needed for spasms    [provider]  ibuprofen (ADVIL,MOTRIN) 600 MG tablet Take 1 tablet (600 mg total) by mouth every 6 (six) hours as needed. 11/16/17   Michela PitcherFawze, Mina A, PA-C  levothyroxine (SYNTHROID) 88 MCG tablet Take 88 mcg by mouth daily.    [provider]  LORazepam (ATIVAN) 1 MG tablet Take 1 tablet (1 mg total) by mouth 3 (three) times daily as needed for anxiety (or itching). 09/16/12   Dione BoozeGlick, David, MD  metaxalone (SKELAXIN) 800 MG tablet Take 800 mg by mouth 3 (three) times daily as needed for muscle spasms.  10/23/17   [provider]  metFORMIN (GLUCOPHAGE) 500 MG tablet Take 500 mg by mouth at bedtime.  [provider]  oxyCODONE (ROXICODONE) 5 MG immediate release tablet Take 1 tablet (5 mg total) by mouth every 6 (six) hours as needed for severe pain. 10/22/18   Myrene BuddyFletcher, Maddix Heinz, MD  predniSONE (DELTASONE) 50 MG tablet Take 1 tablet (50 mg total) by mouth daily. Patient not taking: Reported on 11/16/2017 09/16/12   Dione BoozeGlick, David, MD    Family History Family History  Problem Relation Age of Onset   Prostate cancer Father     Social History Social History   Tobacco Use   Smoking status: Former Smoker    Years: 10.00    Types: Cigarettes    Quit date: 08/23/1983    Years since quitting: 35.1   Smokeless tobacco: Never Used  Substance Use Topics   Alcohol use: No   Drug use: No     Allergies   Patient has no known allergies.   Review of Systems Review of Systems  Constitutional: Positive  for activity change. Negative for chills and fever.  Respiratory: Negative for cough and shortness of breath.   Cardiovascular: Negative for chest pain.  Gastrointestinal: Negative for abdominal pain, nausea and vomiting.  Genitourinary: Negative for flank pain.  Musculoskeletal: Positive for back pain.    Physical Exam Updated Vital Signs BP (!) 127/93    Pulse 86    Temp 98.1 F (36.7 C)    Resp 20    SpO2 96%   Physical Exam Constitutional:      Appearance: Normal appearance.  HENT:     Head: Normocephalic and atraumatic.  Eyes:     General:        Right eye: No discharge.        Left eye: No discharge.  Neck:     Musculoskeletal: Normal range of motion.  Cardiovascular:     Rate and Rhythm: Normal rate and regular rhythm.     Pulses: Normal pulses.     Heart sounds: Normal heart sounds. No murmur.  Pulmonary:     Effort: Pulmonary effort is normal. No respiratory distress.  Chest:     Chest wall: No tenderness.  Abdominal:     General: Abdomen is flat. There is no distension.     Palpations: There is no mass.  Musculoskeletal: Normal range of motion.        General: No swelling or tenderness.     Comments: Left hip: minimally tender to palpation anterior hip. Very limited rotation, flexion, extension of back  Skin:    General: Skin is warm.     Capillary Refill: Capillary refill takes less than 2 seconds.  Neurological:     General: No focal deficit present.     Mental Status: He is alert and oriented to person, place, and time.     ED Treatments / Results  Labs (all labs ordered are listed, but only abnormal results are displayed) Labs Reviewed  CBC WITH DIFFERENTIAL/PLATELET - Abnormal; Notable for the following components:      Result Value   MCV 104.2 (*)    MCH 34.6 (*)    All other components within normal limits  COMPREHENSIVE METABOLIC PANEL - Abnormal; Notable for the following components:   Glucose, Bld 140 (*)    Creatinine, Ser 1.28 (*)     Total Bilirubin 1.8 (*)    All other components within normal limits  URINALYSIS, ROUTINE W REFLEX MICROSCOPIC - Abnormal; Notable for the following components:   Protein, ur 30 (*)    All other components within  normal limits    EKG None  Radiology Mr Lumbar Spine Wo Contrast  Result Date: 10/22/2018 CLINICAL DATA:  Lower back and left leg pain, no injury, history of surgery EXAM: MRI LUMBAR SPINE WITHOUT CONTRAST TECHNIQUE: Multiplanar, multisequence MR imaging of the lumbar spine was performed. No intravenous contrast was administered. COMPARISON:  February 21, 2018 MRI, CT November 16, 2017 FINDINGS: Segmentation: There are 5 non-rib bearing lumbar type vertebral bodies with the last intervertebral disc space labeled as L5-S1. Alignment:  Normal Vertebrae: The patient is status post decompression and posterior spinal fixation at L5-S1. There is interbody spacer seen at L5-S1. The vertebral body heights are well maintained. Anterior osteophytes are noted in the lower lumbar spine. There is increased T2 signal seen within the right L4 pedicle and facet with subtle T1 hypointensity. No fracture is identified. No area of cortical destruction. Conus medullaris and cauda equina: Conus extends to the L1 level. Conus and cauda equina appear normal. Paraspinal and other soft tissues: There is a multilocular T2 bright cystic mass seen at the upper pole of the left kidney as the prior CT abdomen of November 16, 2017, consistent with renal cyst. Mild fatty atrophy noted within the lower lumbar spine and sacral paraspinal soft tissues. Disc levels: T12-L1:  No significant canal or neural foraminal narrowing. L1-L2: There is a minimal broad-based disc bulge with facet arthrosis, however no significant canal or neural foraminal narrowing. L2-L3: There is a mild broad-based disc bulge with ligamentum flavum hypertrophy and facet arthrosis which causes mild bilateral neural foraminal narrowing. L3-L4: There is a mild  broad-based disc bulge with ligamentum flavum hypertrophy and facet arthrosis which causes mild bilateral neural foraminal narrowing. L4-L5: There is a broad-based disc bulge that is slightly asymmetric to the right, with facet arthrosis and ligamentum flavum hypertrophy. This causes severe bilateral neural foraminal narrowing. There is complete effacement of the thecal sac which measures 4 mm in AP diameter. L5-S1: There is facet arthrosis which causes severe right and mild left neural foraminal narrowing. IMPRESSION: 1. Status post decompression and posterior spinal fixation at L5-S1 2. Lumbar spine spondylosis most notable at L4-L5 with severe bilateral neural foraminal narrowing and severe central canal stenosis. 3. Probable stress reaction noted along the right L4 pedicle and facet. No definite fracture. Electronically Signed   By: Jonna ClarkBindu  Avutu M.D.   On: 10/22/2018 11:05    Procedures Procedures (including critical care time)  Medications Ordered in ED Medications  morphine 2 MG/ML injection 2 mg (has no administration in time range)  diazepam (VALIUM) tablet 2 mg (2 mg Oral Given 10/22/18 0848)  ketorolac (TORADOL) 30 MG/ML injection 30 mg (30 mg Intravenous Given 10/22/18 0843)  oxyCODONE-acetaminophen (PERCOCET/ROXICET) 5-325 MG per tablet 1 tablet (1 tablet Oral Given 10/22/18 [id number])     Initial Impression / Assessment and Plan / ED Course  I have reviewed the triage vital signs and the nursing notes.  Pertinent labs & imaging results that were available during my care of the patient were reviewed by me and considered in my medical decision making (see chart for details).  58 year old male who presents with left lumbar back pain.  States he just overdoing it working in the yard.  Exam consistent with exacerbation of persistent back problems.  Discussed case with Dr. Dutch QuintPoole, on-call neurosurgeon, who recommended a MRI lumbar spine without contrast.  Discussed that patient may need surgical  repair in the near future, and updated imaging will help that process.  For patient's acute  pain we will give 1 dose of Percocet(5/325), Valium 2 mg, and Toradol 30 mg.  Reviewed results of MRI with patient.  Recommended he follow-up with neurosurgery as outpatient.  Recommend he keep his appointment for epidural injection on 10/24/2018.  Gave him additional dose of 2 mg morphine due to pain.  Gave 1 day supply of oxycodone 5 mg.  Final Clinical Impressions(s) / ED Diagnoses   Final diagnoses:  Acute left-sided low back pain without sciatica  Chronic left-sided low back pain without sciatica    ED Discharge Orders         Ordered    oxyCODONE (ROXICODONE) 5 MG immediate release tablet  Every 6 hours PRN     10/22/18 [place] MD PGY-3 Family Medicine Resident   Guadalupe Dawn, MD 10/22/18 [place], [place], DO 10/22/18 1440

## 2018-10-22 NOTE — Discharge Instructions (Signed)
Your back pain is likely due to your previously existing back problem.  We did an MRI which shows progression of the stenosis at your L4/L5 level from previous.  I would call and schedule appointment with your neurosurgeon at your earliest convenience.  Give you 1 day supply of pain medication to take at home.  I would continue take Tylenol 650 mg every 6 hours.  I would alternate that with ibuprofen 600 mg every 6 hours.  Take the oxycodone as needed for breakthrough pain.

## 2018-10-22 NOTE — ED Triage Notes (Addendum)
Per EMS- pt here for eval of chronic lower back pain that got worse around 4. Hx of back surgery. Limited movement, denies recent injury. Scheduled to see neurologist today. PT has not loss of bladder or bowel fcn. No dysuria. Took a muscle relaxer, walked to EMS stretcher.

## 2018-10-22 NOTE — ED Notes (Signed)
Pt went to MRI.

## 2018-10-24 DIAGNOSIS — M461 Sacroiliitis, not elsewhere classified: Secondary | ICD-10-CM | POA: Diagnosis not present

## 2018-10-25 ENCOUNTER — Other Ambulatory Visit: Payer: Self-pay | Admitting: Neurosurgery

## 2018-10-25 DIAGNOSIS — M5416 Radiculopathy, lumbar region: Secondary | ICD-10-CM | POA: Diagnosis not present

## 2018-10-25 DIAGNOSIS — M48062 Spinal stenosis, lumbar region with neurogenic claudication: Secondary | ICD-10-CM | POA: Diagnosis not present

## 2018-10-25 DIAGNOSIS — M48061 Spinal stenosis, lumbar region without neurogenic claudication: Secondary | ICD-10-CM | POA: Diagnosis not present

## 2018-10-30 DIAGNOSIS — N200 Calculus of kidney: Secondary | ICD-10-CM | POA: Diagnosis not present

## 2018-10-30 DIAGNOSIS — N183 Chronic kidney disease, stage 3 (moderate): Secondary | ICD-10-CM | POA: Diagnosis not present

## 2018-10-30 DIAGNOSIS — I129 Hypertensive chronic kidney disease with stage 1 through stage 4 chronic kidney disease, or unspecified chronic kidney disease: Secondary | ICD-10-CM | POA: Diagnosis not present

## 2018-10-30 DIAGNOSIS — E039 Hypothyroidism, unspecified: Secondary | ICD-10-CM | POA: Diagnosis not present

## 2018-11-02 ENCOUNTER — Other Ambulatory Visit (HOSPITAL_COMMUNITY)
Admission: RE | Admit: 2018-11-02 | Discharge: 2018-11-02 | Disposition: A | Discharge status: Home / Self Care | Payer: BC Managed Care – PPO | Source: Ambulatory Visit | Attending: Neurosurgery | Admitting: Neurosurgery

## 2018-11-02 ENCOUNTER — Other Ambulatory Visit: Payer: Self-pay | Admitting: Neurosurgery

## 2018-11-02 DIAGNOSIS — Z20828 Contact with and (suspected) exposure to other viral communicable diseases: Secondary | ICD-10-CM | POA: Diagnosis not present

## 2018-11-02 DIAGNOSIS — Z01812 Encounter for preprocedural laboratory examination: Secondary | ICD-10-CM | POA: Insufficient documentation

## 2018-11-02 LAB — SARS CORONAVIRUS 2 (TAT 6-24 HRS): SARS Coronavirus 2: NEGATIVE

## 2018-11-05 ENCOUNTER — Other Ambulatory Visit: Payer: Self-pay

## 2018-11-05 ENCOUNTER — Encounter (HOSPITAL_COMMUNITY): Payer: Self-pay | Admitting: *Deleted

## 2018-11-05 NOTE — Anesthesia Preprocedure Evaluation (Addendum)
Anesthesia Evaluation  Patient identified by MRN, date of birth, ID band Patient awake    Reviewed: Allergy & Precautions, NPO status , Patient's Chart, lab work & pertinent test results  History of Anesthesia Complications Negative for: history of anesthetic complications  Airway Mallampati: II  TM Distance: >3 FB Neck ROM: Full    Dental  (+) Dental Advisory Given, Caps   Pulmonary sleep apnea (does not use CPAP) , former smoker,  11/02/2018 SARS coronavirus NEG   breath sounds clear to auscultation       Cardiovascular hypertension, Pt. on medications (-) angina Rhythm:Regular Rate:Normal     Neuro/Psych Chronic back pain: narcotics    GI/Hepatic negative GI ROS, (+)     substance abuse  ,   Endo/Other  diabetes (glu 128), Oral Hypoglycemic AgentsHypothyroidism   Renal/GU negative Renal ROS     Musculoskeletal  (+) Arthritis , narcotic dependent  Abdominal (+) + obese,   Peds  Hematology negative hematology ROS (+)   Anesthesia Other Findings   Reproductive/Obstetrics                            Anesthesia Physical Anesthesia Plan  ASA: III  Anesthesia Plan: General   Post-op Pain Management:    Induction: Intravenous  PONV Risk Score and Plan: 3 and Dexamethasone, Ondansetron and Scopolamine patch - Pre-op  Airway Management Planned: Oral ETT  Additional Equipment:   Intra-op Plan:   Post-operative Plan: Extubation in OR  Informed Consent: I have reviewed the patients History and Physical, chart, labs and discussed the procedure including the risks, benefits and alternatives for the proposed anesthesia with the patient or authorized representative who has indicated his/her understanding and acceptance.     Dental advisory given  Plan Discussed with: CRNA and Surgeon  Anesthesia Plan Comments:        Anesthesia Quick Evaluation

## 2018-11-05 NOTE — Progress Notes (Signed)
Mr. Kevin Olsen denies chest pain or shortness of breath. Mr Kevin Olsen has been in quarantine at home with his wife since Covid test 11/02/2018.  PCP is Dr Reynaldo Minium, I requested records. Mr Kevin Olsen denies that he has had an EKG in the past year.  Mr. Kevin Olsen has type II diabetes.  He reports that A1C 1 year ago was 6.4. Patient reports that CBGs run in the low 100's. I instructed patient to check CBG after awaking and every 2 hours until arrival  to the hospital.  I Instructed patient if CBG is less than 70 to drink  1/2 cup of a clear juice. Recheck CBG in 15 minutes then call pre- op desk at [phone] for further instructions.

## 2018-11-06 ENCOUNTER — Encounter (HOSPITAL_COMMUNITY)
Admission: RE | Disposition: A | Discharge status: Home / Self Care | Payer: Self-pay | Source: Home / Self Care | Attending: Neurosurgery

## 2018-11-06 ENCOUNTER — Inpatient Hospital Stay (HOSPITAL_COMMUNITY): Payer: BC Managed Care – PPO

## 2018-11-06 ENCOUNTER — Encounter (HOSPITAL_COMMUNITY): Payer: Self-pay

## 2018-11-06 ENCOUNTER — Inpatient Hospital Stay (HOSPITAL_COMMUNITY): Payer: BC Managed Care – PPO | Admitting: Anesthesiology

## 2018-11-06 ENCOUNTER — Inpatient Hospital Stay (HOSPITAL_COMMUNITY)
Admission: RE | Admit: 2018-11-06 | Discharge: 2018-11-07 | DRG: 455 | Disposition: A | Discharge status: Home / Self Care | Payer: BC Managed Care – PPO | Attending: Neurosurgery | Admitting: Neurosurgery

## 2018-11-06 ENCOUNTER — Other Ambulatory Visit: Payer: Self-pay

## 2018-11-06 DIAGNOSIS — G4730 Sleep apnea, unspecified: Secondary | ICD-10-CM | POA: Diagnosis present

## 2018-11-06 DIAGNOSIS — Z7984 Long term (current) use of oral hypoglycemic drugs: Secondary | ICD-10-CM

## 2018-11-06 DIAGNOSIS — M48061 Spinal stenosis, lumbar region without neurogenic claudication: Principal | ICD-10-CM | POA: Diagnosis present

## 2018-11-06 DIAGNOSIS — I10 Essential (primary) hypertension: Secondary | ICD-10-CM | POA: Diagnosis present

## 2018-11-06 DIAGNOSIS — E119 Type 2 diabetes mellitus without complications: Secondary | ICD-10-CM | POA: Diagnosis present

## 2018-11-06 DIAGNOSIS — Z87442 Personal history of urinary calculi: Secondary | ICD-10-CM | POA: Diagnosis not present

## 2018-11-06 DIAGNOSIS — Z79891 Long term (current) use of opiate analgesic: Secondary | ICD-10-CM

## 2018-11-06 DIAGNOSIS — M549 Dorsalgia, unspecified: Secondary | ICD-10-CM | POA: Diagnosis present

## 2018-11-06 DIAGNOSIS — Z981 Arthrodesis status: Secondary | ICD-10-CM | POA: Diagnosis not present

## 2018-11-06 DIAGNOSIS — Z79899 Other long term (current) drug therapy: Secondary | ICD-10-CM | POA: Diagnosis not present

## 2018-11-06 DIAGNOSIS — M7138 Other bursal cyst, other site: Secondary | ICD-10-CM | POA: Diagnosis present

## 2018-11-06 DIAGNOSIS — M5136 Other intervertebral disc degeneration, lumbar region: Secondary | ICD-10-CM | POA: Diagnosis not present

## 2018-11-06 DIAGNOSIS — Z87891 Personal history of nicotine dependence: Secondary | ICD-10-CM | POA: Diagnosis not present

## 2018-11-06 DIAGNOSIS — Z419 Encounter for procedure for purposes other than remedying health state, unspecified: Secondary | ICD-10-CM

## 2018-11-06 DIAGNOSIS — M532X6 Spinal instabilities, lumbar region: Secondary | ICD-10-CM | POA: Diagnosis not present

## 2018-11-06 HISTORY — DX: Sleep apnea, unspecified: G47.30

## 2018-11-06 HISTORY — DX: Unspecified osteoarthritis, unspecified site: M19.90

## 2018-11-06 LAB — CBC WITH DIFFERENTIAL/PLATELET
Abs Immature Granulocytes: 0.02 10*3/uL (ref 0.00–0.07)
Basophils Absolute: 0.1 10*3/uL (ref 0.0–0.1)
Basophils Relative: 1 %
Eosinophils Absolute: 0.2 10*3/uL (ref 0.0–0.5)
Eosinophils Relative: 2 %
HCT: 47.3 % (ref 39.0–52.0)
Hemoglobin: 15.6 g/dL (ref 13.0–17.0)
Immature Granulocytes: 0 %
Lymphocytes Relative: 26 %
Lymphs Abs: 1.8 10*3/uL (ref 0.7–4.0)
MCH: 34.5 pg — ABNORMAL HIGH (ref 26.0–34.0)
MCHC: 33 g/dL (ref 30.0–36.0)
MCV: 104.6 fL — ABNORMAL HIGH (ref 80.0–100.0)
Monocytes Absolute: 0.8 10*3/uL (ref 0.1–1.0)
Monocytes Relative: 11 %
Neutro Abs: 4.3 10*3/uL (ref 1.7–7.7)
Neutrophils Relative %: 60 %
Platelets: 206 10*3/uL (ref 150–400)
RBC: 4.52 MIL/uL (ref 4.22–5.81)
RDW: 12.2 % (ref 11.5–15.5)
WBC: 7.1 10*3/uL (ref 4.0–10.5)
nRBC: 0 % (ref 0.0–0.2)

## 2018-11-06 LAB — TYPE AND SCREEN
ABO/RH(D): O POS
Antibody Screen: NEGATIVE

## 2018-11-06 LAB — BASIC METABOLIC PANEL
Anion gap: 12 (ref 5–15)
BUN: 21 mg/dL — ABNORMAL HIGH (ref 6–20)
CO2: 19 mmol/L — ABNORMAL LOW (ref 22–32)
Calcium: 8.9 mg/dL (ref 8.9–10.3)
Chloride: 105 mmol/L (ref 98–111)
Creatinine, Ser: 1.23 mg/dL (ref 0.61–1.24)
GFR calc Af Amer: >60 mL/min (ref >60)
GFR calc non Af Amer: >60 mL/min (ref >60)
Glucose, Bld: 136 mg/dL — ABNORMAL HIGH (ref 70–99)
Potassium: 3.9 mmol/L (ref 3.5–5.1)
Sodium: 136 mmol/L (ref 135–145)

## 2018-11-06 LAB — GLUCOSE, CAPILLARY
Glucose-Capillary: 128 mg/dL — ABNORMAL HIGH (ref 70–99)
Glucose-Capillary: 185 mg/dL — ABNORMAL HIGH (ref 70–99)
Glucose-Capillary: 164 mg/dL — ABNORMAL HIGH (ref 70–99)
Glucose-Capillary: 157 mg/dL — ABNORMAL HIGH (ref 70–99)

## 2018-11-06 LAB — HEMOGLOBIN A1C
Hgb A1c MFr Bld: 6.5 % — ABNORMAL HIGH (ref 4.8–5.6)
Mean Plasma Glucose: 139.85 mg/dL

## 2018-11-06 LAB — ABO/RH: ABO/RH(D): O POS

## 2018-11-06 SURGERY — POSTERIOR LUMBAR FUSION 1 LEVEL
Anesthesia: General | Site: Spine Lumbar

## 2018-11-06 MED ORDER — MENTHOL 3 MG MT LOZG: 1.0000 | LOZENGE | OROMUCOSAL | Status: DC | PRN

## 2018-11-06 MED ORDER — MIDAZOLAM HCL 2 MG/2ML IJ SOLN
INTRAMUSCULAR | Status: AC
  Filled 2018-11-06: qty 2

## 2018-11-06 MED ORDER — SUCCINYLCHOLINE CHLORIDE 200 MG/10ML IV SOSY
PREFILLED_SYRINGE | INTRAVENOUS | Status: AC
  Filled 2018-11-06: qty 10

## 2018-11-06 MED ORDER — CEFAZOLIN SODIUM-DEXTROSE 2-4 GM/100ML-% IV SOLN
INTRAVENOUS | Status: AC
  Filled 2018-11-06: qty 100

## 2018-11-06 MED ORDER — LACTATED RINGERS IV SOLN
INTRAVENOUS | Status: DC | PRN
  Administered 2018-11-06 (×2): via INTRAVENOUS

## 2018-11-06 MED ORDER — DEXAMETHASONE SODIUM PHOSPHATE 10 MG/ML IJ SOLN
INTRAMUSCULAR | Status: AC
  Filled 2018-11-06: qty 1

## 2018-11-06 MED ORDER — ACETAMINOPHEN 325 MG PO TABS
650.0000 mg | ORAL_TABLET | ORAL | Status: DC | PRN
  Administered 2018-11-06 – 2018-11-07 (×2): 650 mg via ORAL
  Filled 2018-11-06 (×2): qty 2

## 2018-11-06 MED ORDER — BENAZEPRIL HCL 20 MG PO TABS
20.0000 mg | ORAL_TABLET | Freq: Two times a day (BID) | ORAL | Status: DC
  Administered 2018-11-06 – 2018-11-07 (×2): 20 mg via ORAL
  Filled 2018-11-06 (×4): qty 1

## 2018-11-06 MED ORDER — VANCOMYCIN HCL 1 G IV SOLR
INTRAVENOUS | Status: DC | PRN
  Administered 2018-11-06: 1000 mg via TOPICAL

## 2018-11-06 MED ORDER — FENTANYL CITRATE (PF) 250 MCG/5ML IJ SOLN
INTRAMUSCULAR | Status: AC
  Filled 2018-11-06: qty 5

## 2018-11-06 MED ORDER — CHLORHEXIDINE GLUCONATE CLOTH 2 % EX PADS: 6.0000 | MEDICATED_PAD | Freq: Once | CUTANEOUS | Status: DC

## 2018-11-06 MED ORDER — 0.9 % SODIUM CHLORIDE (POUR BTL) OPTIME
TOPICAL | Status: DC | PRN
  Administered 2018-11-06: 1000 mL

## 2018-11-06 MED ORDER — HYDROMORPHONE HCL 1 MG/ML IJ SOLN
INTRAMUSCULAR | Status: AC
  Filled 2018-11-06: qty 1

## 2018-11-06 MED ORDER — SODIUM CHLORIDE 0.9 % IV SOLN
INTRAVENOUS | Status: DC | PRN
  Administered 2018-11-06: 500 mL

## 2018-11-06 MED ORDER — KETAMINE HCL 10 MG/ML IJ SOLN
INTRAMUSCULAR | Status: DC | PRN
  Administered 2018-11-06: 20 mg via INTRAVENOUS
  Administered 2018-11-06: 10 mg via INTRAVENOUS
  Administered 2018-11-06: 20 mg via INTRAVENOUS

## 2018-11-06 MED ORDER — POLYETHYLENE GLYCOL 3350 17 G PO PACK: 17.0000 g | PACK | Freq: Every day | ORAL | Status: DC | PRN

## 2018-11-06 MED ORDER — THROMBIN 20000 UNITS EX SOLR
CUTANEOUS | Status: DC | PRN
  Administered 2018-11-06: 09:00:00 20 mL via TOPICAL

## 2018-11-06 MED ORDER — DIAZEPAM 5 MG PO TABS
5.0000 mg | ORAL_TABLET | Freq: Four times a day (QID) | ORAL | Status: DC | PRN
  Administered 2018-11-06 – 2018-11-07 (×3): 5 mg via ORAL
  Filled 2018-11-06 (×3): qty 1

## 2018-11-06 MED ORDER — ONDANSETRON HCL 4 MG/2ML IJ SOLN
INTRAMUSCULAR | Status: AC
  Filled 2018-11-06: qty 2

## 2018-11-06 MED ORDER — BUPIVACAINE HCL (PF) 0.25 % IJ SOLN
INTRAMUSCULAR | Status: DC | PRN
  Administered 2018-11-06: 20 mL

## 2018-11-06 MED ORDER — DEXAMETHASONE SODIUM PHOSPHATE 10 MG/ML IJ SOLN
10.0000 mg | Freq: Once | INTRAMUSCULAR | Status: AC
  Administered 2018-11-06: 08:00:00 10 mg via INTRAVENOUS

## 2018-11-06 MED ORDER — ROCURONIUM BROMIDE 10 MG/ML (PF) SYRINGE
PREFILLED_SYRINGE | INTRAVENOUS | Status: AC
  Filled 2018-11-06: qty 20

## 2018-11-06 MED ORDER — SUGAMMADEX SODIUM 200 MG/2ML IV SOLN
INTRAVENOUS | Status: DC | PRN
  Administered 2018-11-06: 500 mg via INTRAVENOUS

## 2018-11-06 MED ORDER — PROPOFOL 10 MG/ML IV BOLUS
INTRAVENOUS | Status: AC
  Filled 2018-11-06: qty 40

## 2018-11-06 MED ORDER — ACETAMINOPHEN 650 MG RE SUPP: 650.0000 mg | RECTAL | Status: DC | PRN

## 2018-11-06 MED ORDER — BISACODYL 10 MG RE SUPP: 10.0000 mg | Freq: Every day | RECTAL | Status: DC | PRN

## 2018-11-06 MED ORDER — MIDAZOLAM HCL 2 MG/2ML IJ SOLN: 0.5000 mg | Freq: Once | INTRAMUSCULAR | Status: DC | PRN

## 2018-11-06 MED ORDER — LIDOCAINE 2% (20 MG/ML) 5 ML SYRINGE
INTRAMUSCULAR | Status: DC | PRN
  Administered 2018-11-06: 40 mg via INTRAVENOUS

## 2018-11-06 MED ORDER — PHENOL 1.4 % MT LIQD: 1.0000 | OROMUCOSAL | Status: DC | PRN

## 2018-11-06 MED ORDER — CEFAZOLIN SODIUM 1 G IJ SOLR
INTRAMUSCULAR | Status: AC
  Filled 2018-11-06: qty 10

## 2018-11-06 MED ORDER — FENTANYL CITRATE (PF) 100 MCG/2ML IJ SOLN
INTRAMUSCULAR | Status: DC | PRN
  Administered 2018-11-06 (×2): 100 ug via INTRAVENOUS
  Administered 2018-11-06 (×2): 50 ug via INTRAVENOUS
  Administered 2018-11-06 (×2): 100 ug via INTRAVENOUS
  Administered 2018-11-06: 50 ug via INTRAVENOUS
  Administered 2018-11-06: 100 ug via INTRAVENOUS

## 2018-11-06 MED ORDER — ATORVASTATIN CALCIUM 10 MG PO TABS
20.0000 mg | ORAL_TABLET | Freq: Every evening | ORAL | Status: DC
  Administered 2018-11-06: 20 mg via ORAL
  Filled 2018-11-06: qty 2

## 2018-11-06 MED ORDER — OXYCODONE HCL 5 MG PO TABS
10.0000 mg | ORAL_TABLET | ORAL | Status: DC | PRN
  Administered 2018-11-06 – 2018-11-07 (×6): 10 mg via ORAL
  Filled 2018-11-06 (×6): qty 2

## 2018-11-06 MED ORDER — ONDANSETRON HCL 4 MG/2ML IJ SOLN
INTRAMUSCULAR | Status: DC | PRN
  Administered 2018-11-06: 4 mg via INTRAVENOUS

## 2018-11-06 MED ORDER — LEVOTHYROXINE SODIUM 100 MCG PO TABS
100.0000 ug | ORAL_TABLET | Freq: Every day | ORAL | Status: DC
  Administered 2018-11-07: 100 ug via ORAL
  Filled 2018-11-06: qty 1

## 2018-11-06 MED ORDER — THROMBIN 20000 UNITS EX SOLR
CUTANEOUS | Status: AC
  Filled 2018-11-06: qty 20000

## 2018-11-06 MED ORDER — MIDAZOLAM HCL 5 MG/5ML IJ SOLN
INTRAMUSCULAR | Status: DC | PRN
  Administered 2018-11-06: 2 mg via INTRAVENOUS

## 2018-11-06 MED ORDER — PROPOFOL 10 MG/ML IV BOLUS
INTRAVENOUS | Status: DC | PRN
  Administered 2018-11-06: 200 mg via INTRAVENOUS

## 2018-11-06 MED ORDER — ESMOLOL HCL 100 MG/10ML IV SOLN
INTRAVENOUS | Status: AC
  Filled 2018-11-06: qty 10

## 2018-11-06 MED ORDER — ROCURONIUM BROMIDE 10 MG/ML (PF) SYRINGE
PREFILLED_SYRINGE | INTRAVENOUS | Status: DC | PRN
  Administered 2018-11-06: 20 mg via INTRAVENOUS
  Administered 2018-11-06: 70 mg via INTRAVENOUS
  Administered 2018-11-06: 30 mg via INTRAVENOUS
  Administered 2018-11-06: 40 mg via INTRAVENOUS
  Administered 2018-11-06: 20 mg via INTRAVENOUS

## 2018-11-06 MED ORDER — HYDROCODONE-ACETAMINOPHEN 10-325 MG PO TABS: 1.0000 | ORAL_TABLET | ORAL | Status: DC | PRN

## 2018-11-06 MED ORDER — GABAPENTIN 300 MG PO CAPS
300.0000 mg | ORAL_CAPSULE | Freq: Three times a day (TID) | ORAL | Status: DC | PRN
  Administered 2018-11-06 – 2018-11-07 (×2): 300 mg via ORAL
  Filled 2018-11-06 (×2): qty 1

## 2018-11-06 MED ORDER — PROMETHAZINE HCL 25 MG/ML IJ SOLN: 6.2500 mg | INTRAMUSCULAR | Status: DC | PRN

## 2018-11-06 MED ORDER — BUPIVACAINE HCL (PF) 0.25 % IJ SOLN
INTRAMUSCULAR | Status: AC
  Filled 2018-11-06: qty 30

## 2018-11-06 MED ORDER — DIPHENHYDRAMINE HCL 25 MG PO CAPS
25.0000 mg | ORAL_CAPSULE | Freq: Every day | ORAL | Status: DC
  Administered 2018-11-06: 25 mg via ORAL
  Filled 2018-11-06: qty 1

## 2018-11-06 MED ORDER — SCOPOLAMINE 1 MG/3DAYS TD PT72
1.0000 | MEDICATED_PATCH | Freq: Once | TRANSDERMAL | Status: DC
  Administered 2018-11-06: 1.5 mg via TRANSDERMAL
  Filled 2018-11-06: qty 1

## 2018-11-06 MED ORDER — METFORMIN HCL 500 MG PO TABS
500.0000 mg | ORAL_TABLET | Freq: Every day | ORAL | Status: DC
  Administered 2018-11-06: 500 mg via ORAL
  Filled 2018-11-06: qty 1

## 2018-11-06 MED ORDER — ONDANSETRON HCL 4 MG PO TABS: 4.0000 mg | ORAL_TABLET | Freq: Four times a day (QID) | ORAL | Status: DC | PRN

## 2018-11-06 MED ORDER — HYDROMORPHONE HCL 1 MG/ML IJ SOLN
1.0000 mg | INTRAMUSCULAR | Status: DC | PRN
  Administered 2018-11-06: 1 mg via INTRAVENOUS
  Filled 2018-11-06: qty 1

## 2018-11-06 MED ORDER — FLEET ENEMA 7-19 GM/118ML RE ENEM: 1.0000 | ENEMA | Freq: Once | RECTAL | Status: DC | PRN

## 2018-11-06 MED ORDER — ONDANSETRON HCL 4 MG/2ML IJ SOLN: 4.0000 mg | Freq: Four times a day (QID) | INTRAMUSCULAR | Status: DC | PRN

## 2018-11-06 MED ORDER — HYDROMORPHONE HCL 1 MG/ML IJ SOLN
0.2500 mg | INTRAMUSCULAR | Status: DC | PRN
  Administered 2018-11-06 (×4): 0.5 mg via INTRAVENOUS

## 2018-11-06 MED ORDER — LIDOCAINE 2% (20 MG/ML) 5 ML SYRINGE
INTRAMUSCULAR | Status: AC
  Filled 2018-11-06: qty 5

## 2018-11-06 MED ORDER — DOCUSATE SODIUM 100 MG PO CAPS
100.0000 mg | ORAL_CAPSULE | Freq: Two times a day (BID) | ORAL | Status: DC
  Administered 2018-11-06 – 2018-11-07 (×2): 100 mg via ORAL
  Filled 2018-11-06 (×2): qty 1

## 2018-11-06 MED ORDER — SODIUM CHLORIDE 0.9% FLUSH
3.0000 mL | Freq: Two times a day (BID) | INTRAVENOUS | Status: DC
  Administered 2018-11-06 (×2): 3 mL via INTRAVENOUS

## 2018-11-06 MED ORDER — MEPERIDINE HCL 25 MG/ML IJ SOLN: 6.2500 mg | INTRAMUSCULAR | Status: DC | PRN

## 2018-11-06 MED ORDER — KETAMINE HCL 50 MG/5ML IJ SOSY
PREFILLED_SYRINGE | INTRAMUSCULAR | Status: AC
  Filled 2018-11-06: qty 5

## 2018-11-06 MED ORDER — CEFAZOLIN SODIUM-DEXTROSE 2-4 GM/100ML-% IV SOLN
2.0000 g | INTRAVENOUS | Status: AC
  Administered 2018-11-06: 08:00:00 2 g via INTRAVENOUS

## 2018-11-06 MED ORDER — SODIUM CHLORIDE 0.9 % IV SOLN
250.0000 mL | INTRAVENOUS | Status: DC
  Administered 2018-11-06: 250 mL via INTRAVENOUS

## 2018-11-06 MED ORDER — CEFAZOLIN SODIUM-DEXTROSE 1-4 GM/50ML-% IV SOLN
1.0000 g | Freq: Three times a day (TID) | INTRAVENOUS | Status: AC
  Administered 2018-11-06 – 2018-11-07 (×2): 1 g via INTRAVENOUS
  Filled 2018-11-06 (×2): qty 50

## 2018-11-06 MED ORDER — INSULIN ASPART 100 UNIT/ML ~~LOC~~ SOLN: 0.0000 [IU] | Freq: Three times a day (TID) | SUBCUTANEOUS | Status: DC

## 2018-11-06 MED ORDER — SODIUM CHLORIDE 0.9 % IV SOLN
INTRAVENOUS | Status: DC | PRN
  Administered 2018-11-06: 30 ug/min via INTRAVENOUS

## 2018-11-06 MED ORDER — VANCOMYCIN HCL 1000 MG IV SOLR
INTRAVENOUS | Status: AC
  Filled 2018-11-06: qty 1000

## 2018-11-06 MED ORDER — SODIUM CHLORIDE 0.9% FLUSH: 3.0000 mL | INTRAVENOUS | Status: DC | PRN

## 2018-11-06 SURGICAL SUPPLY — 72 items
ADH SKN CLS APL DERMABOND .7 (GAUZE/BANDAGES/DRESSINGS) ×1
APL SKNCLS STERI-STRIP NONHPOA (GAUZE/BANDAGES/DRESSINGS) ×1
BAG DECANTER FOR FLEXI CONT (MISCELLANEOUS) ×2 IMPLANT
BENZOIN TINCTURE PRP APPL 2/3 (GAUZE/BANDAGES/DRESSINGS) ×2 IMPLANT
BLADE CLIPPER SURG (BLADE) ×1 IMPLANT
BUR CUTTER 7.0 ROUND (BURR) IMPLANT
BUR MATCHSTICK NEURO 3.0 LAGG (BURR) ×2 IMPLANT
CANISTER SUCT 3000ML PPV (MISCELLANEOUS) ×2 IMPLANT
CAP LCK SPNE (Orthopedic Implant) ×4 IMPLANT
CAP LOCK SPINE RADIUS (Orthopedic Implant) IMPLANT
CAP LOCKING (Orthopedic Implant) ×8 IMPLANT
CARTRIDGE OIL MAESTRO DRILL (MISCELLANEOUS) ×1 IMPLANT
CONT SPEC 4OZ CLIKSEAL STRL BL (MISCELLANEOUS) ×2 IMPLANT
COVER BACK TABLE 60X90IN (DRAPES) ×2 IMPLANT
COVER WAND RF STERILE (DRAPES) ×1 IMPLANT
DECANTER SPIKE VIAL GLASS SM (MISCELLANEOUS) ×2 IMPLANT
DERMABOND ADVANCED (GAUZE/BANDAGES/DRESSINGS) ×1
DERMABOND ADVANCED .7 DNX12 (GAUZE/BANDAGES/DRESSINGS) ×1 IMPLANT
DIFFUSER DRILL AIR PNEUMATIC (MISCELLANEOUS) ×2 IMPLANT
DRAPE C-ARM 42X72 X-RAY (DRAPES) ×4 IMPLANT
DRAPE HALF SHEET 40X57 (DRAPES) IMPLANT
DRAPE LAPAROTOMY 100X72X124 (DRAPES) ×2 IMPLANT
DRAPE SURG 17X23 STRL (DRAPES) ×8 IMPLANT
DRSG OPSITE POSTOP 4X6 (GAUZE/BANDAGES/DRESSINGS) ×2 IMPLANT
DURAPREP 26ML APPLICATOR (WOUND CARE) ×2 IMPLANT
ELECT REM PT RETURN 9FT ADLT (ELECTROSURGICAL) ×2
ELECTRODE REM PT RTRN 9FT ADLT (ELECTROSURGICAL) ×1 IMPLANT
EVACUATOR 1/8 PVC DRAIN (DRAIN) IMPLANT
GAUZE 4X4 16PLY RFD (DISPOSABLE) IMPLANT
GAUZE SPONGE 4X4 12PLY STRL (GAUZE/BANDAGES/DRESSINGS) IMPLANT
GLOVE BIO SURGEON STRL SZ 6.5 (GLOVE) ×1 IMPLANT
GLOVE BIO SURGEON STRL SZ7 (GLOVE) ×2 IMPLANT
GLOVE BIOGEL PI IND STRL 6.5 (GLOVE) IMPLANT
GLOVE BIOGEL PI IND STRL 7.5 (GLOVE) IMPLANT
GLOVE BIOGEL PI INDICATOR 6.5 (GLOVE) ×2
GLOVE BIOGEL PI INDICATOR 7.5 (GLOVE) ×2
GLOVE ECLIPSE 9.0 STRL (GLOVE) ×5 IMPLANT
GLOVE EXAM NITRILE XL STR (GLOVE) IMPLANT
GLOVE SURG SS PI 6.0 STRL IVOR (GLOVE) ×1 IMPLANT
GLOVE SURG SS PI 7.0 STRL IVOR (GLOVE) ×4 IMPLANT
GOWN STRL REUS W/ TWL LRG LVL3 (GOWN DISPOSABLE) IMPLANT
GOWN STRL REUS W/ TWL XL LVL3 (GOWN DISPOSABLE) ×2 IMPLANT
GOWN STRL REUS W/TWL 2XL LVL3 (GOWN DISPOSABLE) IMPLANT
GOWN STRL REUS W/TWL LRG LVL3 (GOWN DISPOSABLE) ×4
GOWN STRL REUS W/TWL XL LVL3 (GOWN DISPOSABLE) ×8
GRAFT BN 5X1XSPNE CVD POST DBM (Bone Implant) IMPLANT
GRAFT BONE MAGNIFUSE 1X5CM (Bone Implant) ×2 IMPLANT
KIT BASIN OR (CUSTOM PROCEDURE TRAY) ×2 IMPLANT
KIT TURNOVER KIT B (KITS) ×2 IMPLANT
MILL MEDIUM DISP (BLADE) ×2 IMPLANT
NEEDLE HYPO 22GX1.5 SAFETY (NEEDLE) ×2 IMPLANT
NS IRRIG 1000ML POUR BTL (IV SOLUTION) ×2 IMPLANT
OIL CARTRIDGE MAESTRO DRILL (MISCELLANEOUS) ×2
PACK LAMINECTOMY NEURO (CUSTOM PROCEDURE TRAY) ×2 IMPLANT
RASP 3.0MM (RASP) ×2 IMPLANT
ROD RADIUS 40MM (Neuro Prosthesis/Implant) ×4 IMPLANT
ROD SPNL 40X5.5XNS TI RDS (Neuro Prosthesis/Implant) IMPLANT
SCREW 5.75X45MM (Screw) ×2 IMPLANT
SCREW 6.75X45MM (Screw) ×2 IMPLANT
SPACER ELEVATE STD 23X11 (Spacer) ×2 IMPLANT
SPONGE LAP 4X18 RFD (DISPOSABLE) ×1 IMPLANT
SPONGE SURGIFOAM ABS GEL 100 (HEMOSTASIS) ×2 IMPLANT
STRIP CLOSURE SKIN 1/2X4 (GAUZE/BANDAGES/DRESSINGS) ×4 IMPLANT
SUT VIC AB 0 CT1 18XCR BRD8 (SUTURE) ×2 IMPLANT
SUT VIC AB 0 CT1 8-18 (SUTURE) ×4
SUT VIC AB 2-0 CT1 18 (SUTURE) ×2 IMPLANT
SUT VIC AB 3-0 SH 8-18 (SUTURE) ×4 IMPLANT
TOWEL GREEN STERILE (TOWEL DISPOSABLE) ×2 IMPLANT
TOWEL GREEN STERILE FF (TOWEL DISPOSABLE) ×2 IMPLANT
TRAY FOLEY MTR SLVR 16FR STAT (SET/KITS/TRAYS/PACK) ×2 IMPLANT
TUBE CONNECTING 12X1/4 (SUCTIONS) ×1 IMPLANT
WATER STERILE IRR 1000ML POUR (IV SOLUTION) ×2 IMPLANT

## 2018-11-06 NOTE — H&P (Signed)
Kevin Olsen is an 58 y.o. male.   Chief Complaint: Back pain HPI: 58 year old male status post prior L5-S1 decompression and fusion with good results.  Patient presents with back and bilateral lower extremity pain increasing sensory loss and some weakness.  Work-up demonstrates evidence of marked adjacent level degeneration with severe stenosis at L4-5.  Patient is failed conservative management presents now for decompression and fusion in hopes of improving his symptoms.  Past Medical History:  Diagnosis Date  . Abdominal pain    lower abd pain  . AC (acromioclavicular) joint bone spurs   . Arthritis   . Central apnea   . Diabetes mellitus without complication ([place])   . Esophageal pain   . History of kidney stones   . Hypertension   . Neck pain    neck and back pain  . Sleep apnea    central apnea- while on oxycodone.  11/06/2018 not current    Past Surgical History:  Procedure Laterality Date  . back fusion     with screws and bolts  . COLONOSCOPY    . ELBOW SURGERY     left  . KNEE SURGERY     right knee  . NASAL FRACTURE SURGERY      Family History  Problem Relation Age of Onset  . Lung cancer Mother   . Prostate cancer Father    Social History:  reports that he quit smoking about 35 years ago. His smoking use included cigarettes. He quit after 10.00 years of use. He has never used smokeless tobacco. He reports that he does not drink alcohol or use drugs.  Allergies: No Known Allergies  Medications Prior to Admission  Medication Sig Dispense Refill  . atorvastatin (LIPITOR) 20 MG tablet Take 20 mg by mouth every evening.    . benazepril (LOTENSIN) 20 MG tablet Take 20 mg by mouth 2 (two) times daily.    . diphenhydrAMINE (BENADRYL) 25 MG tablet Take 25 mg by mouth at bedtime.    . gabapentin (NEURONTIN) 300 MG capsule Take 300 mg by mouth 3 (three) times daily as needed (pain).    Marland Kitchen HYDROcodone-acetaminophen (NORCO/VICODIN) 5-325 MG tablet Take 1 tablet by  mouth every 6 (six) hours as needed for moderate pain.    Marland Kitchen levothyroxine (SYNTHROID) 100 MCG tablet Take 100 mcg by mouth daily before breakfast.    . metaxalone (SKELAXIN) 800 MG tablet Take 800 mg by mouth 3 (three) times daily as needed for muscle spasms.   3  . metFORMIN (GLUCOPHAGE) 500 MG tablet Take 500 mg by mouth at bedtime.     . naproxen sodium (ALEVE) 220 MG tablet Take 220 mg by mouth 3 (three) times daily as needed (pain).    Marland Kitchen oxyCODONE (ROXICODONE) 5 MG immediate release tablet Take 1 tablet (5 mg total) by mouth every 6 (six) hours as needed for severe pain. (Patient not taking: Reported on 10/31/2018) 4 tablet 0    Results for orders placed or performed during the hospital encounter of 11/06/18 (from the past 48 hour(s))  Glucose, capillary     Status: Abnormal   Collection Time: 11/06/18  5:43 AM  Result Value Ref Range   Glucose-Capillary 128 (H) 70 - 99 mg/dL  Basic metabolic panel     Status: Abnormal   Collection Time: 11/06/18  5:54 AM  Result Value Ref Range   Sodium 136 135 - 145 mmol/L   Potassium 3.9 3.5 - 5.1 mmol/L   Chloride 105 98 -  111 mmol/L   CO2 19 (L) 22 - 32 mmol/L   Glucose, Bld 136 (H) 70 - 99 mg/dL   BUN 21 (H) 6 - 20 mg/dL   Creatinine, Ser 4.091.23 0.61 - 1.24 mg/dL   Calcium 8.9 8.9 - 81.110.3 mg/dL   GFR calc non Af Amer >60 >60 mL/min   GFR calc Af Amer >60 >60 mL/min   Anion gap 12 5 - 15    Comment: Performed at [facility], [address] [place], [place] [address]  Hemoglobin A1c     Status: Abnormal   Collection Time: 11/06/18  5:55 AM  Result Value Ref Range   Hgb A1c MFr Bld 6.5 (H) 4.8 - 5.6 %    Comment: (NOTE) Pre diabetes:          5.7%-6.4% Diabetes:              >6.4% Glycemic control for   <7.0% adults with diabetes    Mean Plasma Glucose 139.85 mg/dL    Comment: Performed at [facility], [address] [place], [place] [address]  CBC WITH DIFFERENTIAL     Status: Abnormal   Collection Time: 11/06/18  5:58 AM   Result Value Ref Range   WBC 7.1 4.0 - 10.5 K/uL   RBC 4.52 4.22 - 5.81 MIL/uL   Hemoglobin 15.6 13.0 - 17.0 g/dL   HCT 13.047.3 86.539.0 - 78.452.0 %   MCV 104.6 (H) 80.0 - 100.0 fL   MCH 34.5 (H) 26.0 - 34.0 pg   MCHC 33.0 30.0 - 36.0 g/dL   RDW 69.612.2 29.511.5 - 28.415.5 %   Platelets 206 150 - 400 K/uL   nRBC 0.0 0.0 - 0.2 %   Neutrophils Relative % 60 %   Neutro Abs 4.3 1.7 - 7.7 K/uL   Lymphocytes Relative 26 %   Lymphs Abs 1.8 0.7 - 4.0 K/uL   Monocytes Relative 11 %   Monocytes Absolute 0.8 0.1 - 1.0 K/uL   Eosinophils Relative 2 %   Eosinophils Absolute 0.2 0.0 - 0.5 K/uL   Basophils Relative 1 %   Basophils Absolute 0.1 0.0 - 0.1 K/uL   Immature Granulocytes 0 %   Abs Immature Granulocytes 0.02 0.00 - 0.07 K/uL    Comment: Performed at [facility], [address] [place], [place] [address]  Type and screen     Status: None   Collection Time: 11/06/18  6:10 AM  Result Value Ref Range   ABO/RH(D) O POS    Antibody Screen NEG    Sample Expiration      11/09/2018,2359 Performed at [facility], [address] [place], [place] [address]    No results found.  Pertinent items noted in HPI and remainder of comprehensive ROS otherwise negative.  Blood pressure (!) 143/87, pulse 71, temperature 98.7 F (37.1 C), temperature source Oral, resp. rate 18, height 5\' 11"  (1.803 m), weight 106.6 kg, SpO2 98 %.  Patient is awake and alert.  He is oriented and appropriate.  Speech is fluent.  Judgment insight are intact.  Cranial nerve function normal bilateral per examination head ears eyes of throat unremarkable her chest and abdomen are benign.  Extremities are free from injury or deformity.  Motor examination extremities with mild weakness of dorsiflexion bilaterally otherwise motor strength intact.  Sensory examination with decreased sensation to pinprick light touch in L5 and S1 dermatomes bilateral.  Deep tendon reflexes normal active step his Achilles  reflexes are absent.  No  evidence of long track signs.  Gait is antalgic.  Posture is mildly flexed. Assessment/Plan L4-5 degenerative disc disease with severe stenosis and large posterior synovial cyst.  Plan bilateral L4-5 decompressive laminectomy with resection of synovial cyst.  L4-5 posterior lumbar interbody fusion utilizing interbody cages, local harvested autograft, and augmented with posterior arthrodesis utilizing nonsegmental pedicle screw fixation.  Risks and benefits been explained.  Patient wishes to proceed.  Sherilyn CooterHenry A Ashe Graybeal 11/06/2018, 7:48 AM

## 2018-11-06 NOTE — Anesthesia Postprocedure Evaluation (Signed)
Anesthesia Post Note  Patient: Kevin Olsen  Procedure(s) Performed: POSTERIOR LUMBAR INTERBODY FUSION LUMBAR FOUR-FIVE WITH REMOVAL OF HARDWARE LUMBAR FIVE-SACRAL ONE WITH LAMINECTOMY FOR SYNOVIAL CYST (N/A Spine Lumbar)     Patient location during evaluation: PACU Anesthesia Type: General Level of consciousness: awake and alert, oriented and patient cooperative Pain management: pain level controlled Vital Signs Assessment: post-procedure vital signs reviewed and stable Respiratory status: spontaneous breathing, nonlabored ventilation and respiratory function stable Cardiovascular status: blood pressure returned to baseline and stable Postop Assessment: no apparent nausea or vomiting Anesthetic complications: no    Last Vitals:  Vitals:   11/06/18 1330 11/06/18 1624  BP: (!) 144/83 (!) 132/94  Pulse: (!) 102 96  Resp: (!) 21 16  Temp: 36.7 C 36.9 C  SpO2: 100% 95%    Last Pain:  Vitals:   11/06/18 1624  TempSrc: Oral  PainSc:                  Azya Barbero,E. Julieanne Hadsall

## 2018-11-06 NOTE — Transfer of Care (Signed)
Immediate Anesthesia Transfer of Care Note  Patient: Kevin Olsen  Procedure(s) Performed: POSTERIOR LUMBAR INTERBODY FUSION LUMBAR FOUR-FIVE WITH REMOVAL OF HARDWARE LUMBAR FIVE-SACRAL ONE WITH LAMINECTOMY FOR SYNOVIAL CYST (N/A Spine Lumbar)  Patient Location: PACU  Anesthesia Type:General  Level of Consciousness: drowsy  Airway & Oxygen Therapy: Patient Spontanous Breathing and Patient connected to nasal cannula oxygen  Post-op Assessment: Report given to RN, Post -op Vital signs reviewed and stable and Patient moving all extremities  Post vital signs: Reviewed and stable  Last Vitals:  Vitals Value Taken Time  BP 160/98 11/06/18 1142  Temp    Pulse 107 11/06/18 1147  Resp 8 11/06/18 1147  SpO2 98 % 11/06/18 1147  Vitals shown include unvalidated device data.  Last Pain:  Vitals:   11/06/18 0604  TempSrc:   PainSc: 2          Complications: No apparent anesthesia complications

## 2018-11-06 NOTE — Brief Op Note (Signed)
11/06/2018  11:24 AM  PATIENT:  Ilene Qua Hanback  58 y.o. male  PRE-OPERATIVE DIAGNOSIS:  Stenosis  POST-OPERATIVE DIAGNOSIS:  Stenosis  PROCEDURE:  Procedure(s) with comments: POSTERIOR LUMBAR INTERBODY FUSION LUMBAR FOUR-FIVE WITH REMOVAL OF HARDWARE LUMBAR FIVE-SACRAL ONE WITH LAMINECTOMY FOR SYNOVIAL CYST (N/A) - posterior  SURGEON:  Surgeon(s) and Role:    * Earnie Larsson, MD - Primary  PHYSICIAN ASSISTANT:   ASSISTANTS: Bergman<NP   ANESTHESIA:   general  EBL:  300 mL   BLOOD ADMINISTERED:none  DRAINS: none   LOCAL MEDICATIONS USED:  MARCAINE     SPECIMEN:  No Specimen  DISPOSITION OF SPECIMEN:  N/A  COUNTS:  YES  TOURNIQUET:  * No tourniquets in log *  DICTATION: .Dragon Dictation  PLAN OF CARE: Admit to inpatient   PATIENT DISPOSITION:  PACU - hemodynamically stable.   Delay start of Pharmacological VTE agent (>24hrs) due to surgical blood loss or risk of bleeding: yes

## 2018-11-06 NOTE — Op Note (Signed)
Date of procedure: 11/06/2018  Date of dictation: Same  Service: Neurosurgery  Preoperative diagnosis: L4-5 synovial cyst with severe stenosis and instability, status post prior L4-5 S1 decompression and fusion surgery  Postoperative diagnosis: Same  Procedure Name: Bilateral L4-5 decompressive laminotomies with resection of adherent synovial cyst, microdissection  L4-5 posterior lumbar interbody fusion utilizing interbody cages and locally harvested autograft  L4-5 posterior lateral arthrodesis utilizing nonsegmental pedicle screw fixation and local autograft  Removal of L5-S1 instrumentation  Surgeon:Briar Witherspoon A.Ramzy Cappelletti, M.D.  Asst. Surgeon: Reinaldo Meeker, NP  Anesthesia: General  Indication: 57 year old male status post prior L5-S1 decompression and fusion with good results presents with severe back and bilateral lower extremity pain.  Work-up demonstrates evidence of marked stenosis at L4-5 with severe facet arthropathy and a large midline synovial cyst causing severe compression of the thecal sac.  Patient is failed conservative management.  He presents now for decompression and fusion in hopes of improving his symptoms.  Operative note: After induction anesthesia, patient was positioned prone onto a Wilson frame and appropriate padded.  Lumbar region prepped and draped sterilely.  Incision made from L4-S1.  Dissection performed bilaterally.  Retractor placed.  Previous pedicle screw construct at L5-S1 was dissected free.  This was disassembled with a great difficulty requiring cutting the rods above the S1 screws.  After the screw and rod construct was completely removed the pedicles were inspected at L5 and S1 as was the fusion itself.  The fusion was quite solid.  Attention then placed L4-5.  Transverse processes of L4 and L5 were dissected free.  Retractor was placed.  Inferior bilateral laminotomies of L4-5 were then performed using Leksell rongeurs Kerrison rongeurs and the high-speed drill to  remove the inferior two thirds of the lamina of L4 the entire inferior facet of L4 bilaterally the majority the superior facet of L5 bilaterally.  Ligamentum flavum and an adherent synovial cyst were then dissected free from the underlying thecal sac.  While dissecting free the synovial cyst the microscope was used for microdissection of the spinal canal.  At this point a very thorough decompression of the thecal sac is been achieved.  There was no evidence of injury to thecal sac or nerve roots.  Bilateral discectomy was then performed at L4-5.  The space then prepared for interbody fusion.  Soft tissue was movement interspace.  With a distractor placed the patient's right side.  The space was then cleaned of all soft tissue and then an 11 mm Medtronic expandable cage packed with morselized autograft was impacted into place and expanded to its full extent.  Distractor removed patient's right side.  The space once again prepared for interbody fusion and soft tissue removed.  Morselized autograft packed in the interspace.  A second cage packed with autograft was then impacted into place and expanded to its full extent.  Pedicles of L4 with identified bilaterally using surface landmarks and intraoperative fluoroscopy with superficial bone overlying the pedicle was then removed using high-speed drill.  Pedicle was then probed using pedicle all each pedicle tract was then probed and found to be solidly within the bone.  Each pedicle tract was then tapped and then 5.75 mm radius brand screws from [employer] were placed bilaterally at L4 and then 6.75 mm radius screws were placed bilaterally at L5 through the prior screw holes.  Final images reveal good position of the cages and the hardware at the proper operative level with normal alignment of spine.  Wound is then irrigated one final  time.  Transverse processes and residual facets were decorticated at L4-5.  Morselized autograft and magna fuse packets were packed  posterior laterally.  Short segment titanium rod placed of the screws at L4 and L5.  Locking caps placed over the screws and locking caps and engaged with a construct under compression.  Gelfoam was placed over the laminotomy defects.  Vancomycin powder placed in the deep wound space.  Wounds and closed in layers with Vicryl sutures.  Steri-Strips and sterile dressing were applied.  No apparent complications.  Patient tolerated the procedure well and he returns to the recovery room postop.

## 2018-11-06 NOTE — Anesthesia Procedure Notes (Signed)
Procedure Name: Intubation Date/Time: 11/06/2018 8:06 AM Performed by: Cleda Daub, CRNA Pre-anesthesia Checklist: Patient identified, Emergency Drugs available, Suction available and Patient being monitored Patient Re-evaluated:Patient Re-evaluated prior to induction Oxygen Delivery Method: Circle system utilized Preoxygenation: Pre-oxygenation with 100% oxygen Induction Type: IV induction Ventilation: Oral airway inserted - appropriate to patient size and Two handed mask ventilation required Laryngoscope Size: Glidescope and 4 Grade View: Grade I Tube type: Oral Tube size: 7.5 mm Number of attempts: 1 Airway Equipment and Method: Stylet and Video-laryngoscopy Placement Confirmation: positive ETCO2 and breath sounds checked- equal and bilateral Secured at: 22 cm Tube secured with: Tape Dental Injury: Teeth and Oropharynx as per pre-operative assessment

## 2018-11-06 NOTE — Evaluation (Signed)
Physical Therapy Evaluation Patient Details Name: Kevin Olsen MRN: [id number] DOB: 07/21/1960 Today's Date: 11/06/2018   History of Present Illness  Pt is a 58 y.o. male s/p L4-5 PLIF with cyst resection 11/06/18. PMH includes previous L5-S1 decompression, DM, HTN, arthritis.    Clinical Impression  Pt presents with an overall decrease in functional mobility secondary to above. PTA, pt independent and lives with wife. Educ on precautions, positioning, brace wear, and importance of mobility. Today, pt able to ambulate in hallway at supervision-level; independent to don lumbar brace, good awareness of precautions. Pt would benefit from continued acute PT services to maximize functional mobility and independence prior to d/c home.     Follow Up Recommendations No PT follow up;Supervision for mobility/OOB    Equipment Recommendations  None recommended by PT    Recommendations for Other Services       Precautions / Restrictions Precautions Precautions: Fall;Back Precaution Booklet Issued: Yes (comment) Precaution Comments: Pt able to recall 3/3 precautions Required Braces or Orthoses: Spinal Brace Spinal Brace: Lumbar corset;Applied in sitting position Restrictions Weight Bearing Restrictions: No      Mobility  Bed Mobility Overal bed mobility: Modified Independent             General bed mobility comments: Able to perform correct log roll without education  Transfers Overall transfer level: Independent Equipment used: None Transfers: Sit to/from Stand Sit to Stand: Independent            Ambulation/Gait Ambulation/Gait assistance: Supervision Gait Distance (Feet): 350 Feet Assistive device: None Gait Pattern/deviations: Step-through pattern;Decreased stride length Gait velocity: Decreased   General Gait Details: Slow, guarded gait with supervision for safety/fall risk  Stairs            Wheelchair Mobility    Modified Rankin (Stroke Patients  Only)       Balance Overall balance assessment: Needs assistance   Sitting balance-Leahy Scale: Good Sitting balance - Comments: Indep to don brace without assist     Standing balance-Leahy Scale: Good                               Pertinent Vitals/Pain Pain Assessment: 0-10 Pain Score: 5  Pain Location: Lower back Pain Descriptors / Indicators: Discomfort;Operative site guarding Pain Intervention(s): Monitored during session;Patient requesting pain meds-RN notified    Home Living Family/patient expects to be discharged to:: Private residence Living Arrangements: Spouse/significant other Available Help at Discharge: Family;Available 24 hours/day Type of Home: House Home Access: Ramped entrance     Home Layout: One level        Prior Function Level of Independence: Independent         Comments: Does not work     Higher education careers adviserHand Dominance        Extremity/Trunk Assessment   Upper Extremity Assessment Upper Extremity Assessment: Overall WFL for tasks assessed    Lower Extremity Assessment Lower Extremity Assessment: Overall WFL for tasks assessed       Communication   Communication: No difficulties  Cognition Arousal/Alertness: Awake/alert Behavior During Therapy: WFL for tasks assessed/performed Overall Cognitive Status: Within Functional Limits for tasks assessed                                        General Comments      Exercises     Assessment/Plan  PT Assessment Patient needs continued PT services  PT Problem List Decreased activity tolerance;Decreased balance;Decreased mobility;Decreased knowledge of precautions;Pain       PT Treatment Interventions DME instruction;Gait training;Stair training;Functional mobility training;Therapeutic activities;Therapeutic exercise;Balance training;Patient/family education    PT Goals (Current goals can be found in the Care Plan section)  Acute Rehab PT Goals Patient Stated  Goal: Return home tomorrow PT Goal Formulation: With patient Time For Goal Achievement: 11/20/18 Potential to Achieve Goals: Good    Frequency Min 5X/week   Barriers to discharge        Co-evaluation               AM-PAC PT "6 Clicks" Mobility  Outcome Measure Help needed turning from your back to your side while in a flat bed without using bedrails?: None Help needed moving from lying on your back to sitting on the side of a flat bed without using bedrails?: None Help needed moving to and from a bed to a chair (including a wheelchair)?: None Help needed standing up from a chair using your arms (e.g., wheelchair or bedside chair)?: None Help needed to walk in hospital room?: A Little Help needed climbing 3-5 steps with a railing? : A Little 6 Click Score: 22    End of Session Equipment Utilized During Treatment: Gait belt;Back brace Activity Tolerance: Patient tolerated treatment well Patient left: in bed;with call bell/phone within reach Nurse Communication: Mobility status PT Visit Diagnosis: Other abnormalities of gait and mobility (R26.89)    Time: 7096-2836 PT Time Calculation (min) (ACUTE ONLY): 17 min   Charges:   PT Evaluation $PT Eval Low Complexity: [place], PT, DPT Acute Rehabilitation Services  Pager [phone] Office [place] 11/06/2018, 5:19 PM

## 2018-11-06 NOTE — Progress Notes (Signed)
Orthopedic Tech Progress Note Patient Details:  Kevin Olsen 1960-10-28 [id number]  Patient ID: Kevin Olsen, male   DOB: Mar 03, 1961, 58 y.o.   MRN: [id number]   Kevin Olsen 11/06/2018, 12:11 PMCalled [employer] for Lumbar brace.

## 2018-11-07 LAB — GLUCOSE, CAPILLARY: Glucose-Capillary: 132 mg/dL — ABNORMAL HIGH (ref 70–99)

## 2018-11-07 MED ORDER — DIAZEPAM 5 MG PO TABS: 5.0000 mg | ORAL_TABLET | Freq: Four times a day (QID) | ORAL | 0 refills | Status: DC | PRN

## 2018-11-07 MED ORDER — OXYCODONE HCL 10 MG PO TABS: 5.0000 mg | ORAL_TABLET | ORAL | 0 refills | Status: DC | PRN

## 2018-11-07 NOTE — Evaluation (Signed)
Occupational Therapy Evaluation Patient Details Name: Kevin Olsen MRN: [id number] DOB: [birth year] Today's Date: 11/07/2018    History of Present Illness Pt is a 58 y.o. male s/p L4-5 PLIF with cyst resection 11/06/18. PMH includes previous L5-S1 decompression, DM, HTN, arthritis.   Clinical Impression   PTA, pt was living with his wife and was independent. Currently, pt performing ADLs and functional mobility at Supervision level. Provided education and handout on back precautions, bed mobility, grooming, brace management, LB ADLs, toileting, and tub transfer with shower seat; pt demonstrated and verablized understanding. Answered all pt questions. Recommend dc home once medically stable per physician. All acute OT needs met and will sign off. Thank you.     Follow Up Recommendations  No OT follow up    Equipment Recommendations  None recommended by OT    Recommendations for Other Services       Precautions / Restrictions Precautions Precautions: Fall;Back Precaution Booklet Issued: Yes (comment) Precaution Comments: Pt able to recall 3/3 precautions Required Braces or Orthoses: Spinal Brace Spinal Brace: Lumbar corset;Applied in sitting position Restrictions Weight Bearing Restrictions: No      Mobility Bed Mobility Overal bed mobility: Modified Independent             General bed mobility comments: Able to perform correct log roll without education  Transfers Overall transfer level: Independent Equipment used: None Transfers: Sit to/from Stand Sit to Stand: Independent              Balance Overall balance assessment: Needs assistance   Sitting balance-Leahy Scale: Good Sitting balance - Comments: Indep to don brace without assist     Standing balance-Leahy Scale: Good                             ADL either performed or assessed with clinical judgement   ADL Overall ADL's : Needs assistance/impaired                                       General ADL Comments: Pt performing ADLs and functional mobility at Supervision level. Providing pt with handout and education on LB ADLs, brace management, grooming, bed mobility, and back precautions     Vision         Perception     Praxis      Pertinent Vitals/Pain Pain Assessment: Faces Faces Pain Scale: Hurts little more Pain Location: Lower back Pain Descriptors / Indicators: Discomfort;Operative site guarding Pain Intervention(s): Monitored during session;Repositioned;Premedicated before session     Hand Dominance     Extremity/Trunk Assessment Upper Extremity Assessment Upper Extremity Assessment: Overall WFL for tasks assessed   Lower Extremity Assessment Lower Extremity Assessment: Overall WFL for tasks assessed   Cervical / Trunk Assessment Cervical / Trunk Assessment: Other exceptions Cervical / Trunk Exceptions: s/p back sx   Communication Communication Communication: No difficulties   Cognition Arousal/Alertness: Awake/alert Behavior During Therapy: WFL for tasks assessed/performed Overall Cognitive Status: Within Functional Limits for tasks assessed                                     General Comments       Exercises     Shoulder Instructions      Home Living Family/patient expects to be discharged to:: Private  residence Living Arrangements: Spouse/significant other Available Help at Discharge: Family;Available 24 hours/day Type of Home: House Home Access: Ramped entrance     Home Layout: One level     Bathroom Shower/Tub: Teacher, early years/pre: Handicapped height     Home Equipment: Grab bars - toilet;Shower seat          Prior Functioning/Environment Level of Independence: Independent        Comments: Does not work        OT Problem List: Decreased activity tolerance;Decreased knowledge of use of DME or AE;Decreased knowledge of precautions      OT  Treatment/Interventions:      OT Goals(Current goals can be found in the care plan section) Acute Rehab OT Goals Patient Stated Goal: Return home tomorrow OT Goal Formulation: All assessment and education complete, DC therapy  OT Frequency:     Barriers to D/C:            Co-evaluation              AM-PAC OT "6 Clicks" Daily Activity     Outcome Measure Help from another person eating meals?: None Help from another person taking care of personal grooming?: None Help from another person toileting, which includes using toliet, bedpan, or urinal?: None Help from another person bathing (including washing, rinsing, drying)?: None Help from another person to put on and taking off regular upper body clothing?: None Help from another person to put on and taking off regular lower body clothing?: None 6 Click Score: 24   End of Session Equipment Utilized During Treatment: Back brace Nurse Communication: Mobility status  Activity Tolerance: Patient tolerated treatment well Patient left: in bed;with call bell/phone within reach(EOB)                   Time: 0752-0805 OT Time Calculation (min): 13 min Charges:  OT General Charges $OT Visit: 1 Visit OT Evaluation $OT Eval Low Complexity: 1 Low  Deklin Bieler MSOT, OTR/L Acute Rehab Pager: [phone] Office: [place] 11/07/2018, 8:46 AM

## 2018-11-07 NOTE — Discharge Instructions (Signed)

## 2018-11-07 NOTE — Progress Notes (Signed)
Physical Therapy Treatment and Discharge Patient Details Name: Kevin Olsen MRN: [id number] DOB: May 02, 1960 Today's Date: 11/07/2018    History of Present Illness Pt is a 58 y.o. male s/p L4-5 PLIF with cyst resection 11/06/18. PMH includes previous L5-S1 decompression, DM, HTN, arthritis.    PT Comments    Pt progressing well with post-op mobility. Pt was able to recall 3/3 precautions and was educated on car transfer and activity progression at home. Pt and wife report no further questions at this time and pt is currently mobilizing at a modified independent level without an AD. Acute PT will sign off at this time as pt has met goals. If needs change, please reconsult.    Follow Up Recommendations  No PT follow up;Supervision for mobility/OOB     Equipment Recommendations  None recommended by PT    Recommendations for Other Services       Precautions / Restrictions Precautions Precautions: Fall;Back Precaution Booklet Issued: Yes (comment) Precaution Comments: Pt able to recall 3/3 precautions Required Braces or Orthoses: Spinal Brace Spinal Brace: Lumbar corset;Applied in sitting position Restrictions Weight Bearing Restrictions: No    Mobility  Bed Mobility Overal bed mobility: Modified Independent             General bed mobility comments: Able to perform correct log roll without education. Increased time.   Transfers Overall transfer level: Independent Equipment used: None Transfers: Sit to/from Stand Sit to Stand: Independent         General transfer comment: No assist required. Good technique.   Ambulation/Gait Ambulation/Gait assistance: Modified independent (Device/Increase time) Gait Distance (Feet): 400 Feet Assistive device: None Gait Pattern/deviations: Step-through pattern;Decreased stride length Gait velocity: Decreased Gait velocity interpretation: 1.31 - 2.62 ft/sec, indicative of limited community ambulator General Gait Details: Slow  but generally steady without UE support. Good posture.    Stairs             Wheelchair Mobility    Modified Rankin (Stroke Patients Only)       Balance Overall balance assessment: Needs assistance   Sitting balance-Leahy Scale: Good Sitting balance - Comments: Indep to don brace without assist     Standing balance-Leahy Scale: Good                              Cognition Arousal/Alertness: Awake/alert Behavior During Therapy: WFL for tasks assessed/performed Overall Cognitive Status: Within Functional Limits for tasks assessed                                        Exercises      General Comments        Pertinent Vitals/Pain Pain Assessment: Faces Faces Pain Scale: Hurts a little bit Pain Location: Lower back Pain Descriptors / Indicators: Discomfort;Operative site guarding Pain Intervention(s): Monitored during session;Repositioned    Home Living Family/patient expects to be discharged to:: Private residence Living Arrangements: Spouse/significant other Available Help at Discharge: Family;Available 24 hours/day Type of Home: House Home Access: Ramped entrance   Home Layout: One level Home Equipment: Grab bars - toilet;Shower seat      Prior Function Level of Independence: Independent      Comments: Does not work   PT Goals (current goals can now be found in the care plan section) Acute Rehab PT Goals Patient Stated Goal: Return home tomorrow PT Goal  Formulation: With patient Time For Goal Achievement: 11/20/18 Potential to Achieve Goals: Good Progress towards PT goals: Progressing toward goals    Frequency    Min 5X/week      PT Plan Current plan remains appropriate    Co-evaluation              AM-PAC PT "6 Clicks" Mobility   Outcome Measure  Help needed turning from your back to your side while in a flat bed without using bedrails?: None Help needed moving from lying on your back to sitting on  the side of a flat bed without using bedrails?: None Help needed moving to and from a bed to a chair (including a wheelchair)?: None Help needed standing up from a chair using your arms (e.g., wheelchair or bedside chair)?: None Help needed to walk in hospital room?: A Little Help needed climbing 3-5 steps with a railing? : A Little 6 Click Score: 22    End of Session Equipment Utilized During Treatment: Gait belt;Back brace Activity Tolerance: Patient tolerated treatment well Patient left: in bed;with call bell/phone within reach Nurse Communication: Mobility status PT Visit Diagnosis: Other abnormalities of gait and mobility (R26.89)     Time: 9956-6717 PT Time Calculation (min) (ACUTE ONLY): 10 min  Charges:  $Gait Training: 8-22 mins                     Rolinda Roan, PT, DPT Acute Rehabilitation Services Pager: [phone] Office: [phone]    Thelma Comp 11/07/2018, 10:56 AM

## 2018-11-07 NOTE — Discharge Summary (Signed)
Physician Discharge Summary  Patient ID: Kevin Olsen MRN: [id number] DOB/AGE: 10/20/1960 58 y.o.  Admit date: 11/06/2018 Discharge date: 11/07/2018  Admission Diagnoses:  Discharge Diagnoses:  Active Problems:   Synovial cyst of lumbar facet joint   Discharged Condition: good  Hospital Course: Patient admitted to the hospital where he underwent uncomplicated lumbar decompression and fusion.  Postoperatively he is doing very well.  Preoperative lower extremity pain much improved.  Standing and walking better.  Voiding without difficulty.  Ready for discharge home. Consults:   Significant Diagnostic Studies:   Treatments:   Discharge Exam: Blood pressure 115/74, pulse (!) 102, temperature 98 F (36.7 C), temperature source Oral, resp. rate 18, height 5\' 11"  (1.803 m), weight 106.6 kg, SpO2 94 %. Awake and alert.  Oriented and appropriate.  Cranial nerve function intact.  Motor and sensory function extremities normal.  Wound clean and dry.  Chest and abdomen benign.  Disposition: Discharge disposition: 01-Home or Self Care        Allergies as of 11/07/2018   No Known Allergies     Medication List    STOP taking these medications   metaxalone 800 MG tablet Commonly known as: SKELAXIN   naproxen sodium 220 MG tablet Commonly known as: ALEVE     TAKE these medications   atorvastatin 20 MG tablet Commonly known as: LIPITOR Take 20 mg by mouth every evening.   benazepril 20 MG tablet Commonly known as: LOTENSIN Take 20 mg by mouth 2 (two) times daily.   diazepam 5 MG tablet Commonly known as: VALIUM Take 1-2 tablets (5-10 mg total) by mouth every 6 (six) hours as needed for muscle spasms.   diphenhydrAMINE 25 MG tablet Commonly known as: BENADRYL Take 25 mg by mouth at bedtime.   gabapentin 300 MG capsule Commonly known as: NEURONTIN Take 300 mg by mouth 3 (three) times daily as needed (pain).   HYDROcodone-acetaminophen 5-325 MG tablet Commonly known  as: NORCO/VICODIN Take 1 tablet by mouth every 6 (six) hours as needed for moderate pain.   levothyroxine 100 MCG tablet Commonly known as: SYNTHROID Take 100 mcg by mouth daily before breakfast.   metFORMIN 500 MG tablet Commonly known as: GLUCOPHAGE Take 500 mg by mouth at bedtime.   Oxycodone HCl 10 MG Tabs Take 0.5-1 tablets (5-10 mg total) by mouth every 3 (three) hours as needed for severe pain ((score 7 to 10)). What changed:   medication strength  how much to take  when to take this  reasons to take this            Durable Medical Equipment  (From admission, onward)         Start     Ordered   11/06/18 1328  DME Walker rolling  Once    Question:  Patient needs a walker to treat with the following condition  Answer:  Synovial cyst of lumbar facet joint   11/06/18 1327   11/06/18 1328  DME 3 n 1  Once     11/06/18 1327           Signed: Mallie Mussel A Carneshia Raker 11/07/2018, 8:03 AM

## 2018-11-07 NOTE — Plan of Care (Signed)
Patient alert and oriented, Mae's well, voiding adequate amount of urine, swallowing without difficulty, c/o moderate pain and meds given prior to discharged for ride and discomfort. Patient discharged home with family. Script and discharged instructions given to patient. Patient and family stated understanding of instructions given. Patient has an appointment with Dr. Pool 

## 2018-11-08 MED FILL — Heparin Sodium (Porcine) Inj 1000 Unit/ML: INTRAMUSCULAR | Qty: 30 | Status: AC

## 2018-11-08 MED FILL — Sodium Chloride IV Soln 0.9%: INTRAVENOUS | Qty: 1000 | Status: AC

## 2018-12-12 DIAGNOSIS — M48062 Spinal stenosis, lumbar region with neurogenic claudication: Secondary | ICD-10-CM | POA: Diagnosis not present

## 2018-12-27 DIAGNOSIS — Z23 Encounter for immunization: Secondary | ICD-10-CM | POA: Diagnosis not present

## 2019-01-09 DIAGNOSIS — M48062 Spinal stenosis, lumbar region with neurogenic claudication: Secondary | ICD-10-CM | POA: Diagnosis not present

## 2019-01-14 DIAGNOSIS — I10 Essential (primary) hypertension: Secondary | ICD-10-CM | POA: Diagnosis not present

## 2019-01-14 DIAGNOSIS — E785 Hyperlipidemia, unspecified: Secondary | ICD-10-CM | POA: Diagnosis not present

## 2019-01-14 DIAGNOSIS — E1129 Type 2 diabetes mellitus with other diabetic kidney complication: Secondary | ICD-10-CM | POA: Diagnosis not present

## 2019-01-14 DIAGNOSIS — F329 Major depressive disorder, single episode, unspecified: Secondary | ICD-10-CM | POA: Diagnosis not present

## 2019-01-14 DIAGNOSIS — M1990 Unspecified osteoarthritis, unspecified site: Secondary | ICD-10-CM | POA: Diagnosis not present

## 2019-01-14 DIAGNOSIS — E038 Other specified hypothyroidism: Secondary | ICD-10-CM | POA: Diagnosis not present

## 2019-01-14 DIAGNOSIS — I129 Hypertensive chronic kidney disease with stage 1 through stage 4 chronic kidney disease, or unspecified chronic kidney disease: Secondary | ICD-10-CM | POA: Diagnosis not present

## 2019-01-14 DIAGNOSIS — Z125 Encounter for screening for malignant neoplasm of prostate: Secondary | ICD-10-CM | POA: Diagnosis not present

## 2019-01-14 DIAGNOSIS — E039 Hypothyroidism, unspecified: Secondary | ICD-10-CM | POA: Diagnosis not present

## 2019-01-14 DIAGNOSIS — N183 Chronic kidney disease, stage 3 unspecified: Secondary | ICD-10-CM | POA: Diagnosis not present

## 2019-01-14 DIAGNOSIS — E7849 Other hyperlipidemia: Secondary | ICD-10-CM | POA: Diagnosis not present

## 2019-01-14 DIAGNOSIS — Z0000 Encounter for general adult medical examination without abnormal findings: Secondary | ICD-10-CM | POA: Diagnosis not present

## 2019-01-14 DIAGNOSIS — G4730 Sleep apnea, unspecified: Secondary | ICD-10-CM | POA: Diagnosis not present

## 2019-01-14 DIAGNOSIS — J309 Allergic rhinitis, unspecified: Secondary | ICD-10-CM | POA: Diagnosis not present

## 2019-01-14 DIAGNOSIS — M5489 Other dorsalgia: Secondary | ICD-10-CM | POA: Diagnosis not present

## 2019-01-14 DIAGNOSIS — G5600 Carpal tunnel syndrome, unspecified upper limb: Secondary | ICD-10-CM | POA: Diagnosis not present

## 2019-01-25 DIAGNOSIS — Z20828 Contact with and (suspected) exposure to other viral communicable diseases: Secondary | ICD-10-CM | POA: Diagnosis not present

## 2019-01-30 DIAGNOSIS — R82998 Other abnormal findings in urine: Secondary | ICD-10-CM | POA: Diagnosis not present

## 2019-02-06 DIAGNOSIS — M48062 Spinal stenosis, lumbar region with neurogenic claudication: Secondary | ICD-10-CM | POA: Diagnosis not present

## 2019-03-12 DIAGNOSIS — H6692 Otitis media, unspecified, left ear: Secondary | ICD-10-CM | POA: Diagnosis not present

## 2019-03-12 DIAGNOSIS — H8110 Benign paroxysmal vertigo, unspecified ear: Secondary | ICD-10-CM | POA: Diagnosis not present

## 2019-03-12 DIAGNOSIS — N183 Chronic kidney disease, stage 3 unspecified: Secondary | ICD-10-CM | POA: Diagnosis not present

## 2019-03-12 DIAGNOSIS — E1129 Type 2 diabetes mellitus with other diabetic kidney complication: Secondary | ICD-10-CM | POA: Diagnosis not present

## 2019-04-24 DIAGNOSIS — N183 Chronic kidney disease, stage 3 unspecified: Secondary | ICD-10-CM | POA: Diagnosis not present

## 2019-04-24 DIAGNOSIS — Z1331 Encounter for screening for depression: Secondary | ICD-10-CM | POA: Diagnosis not present

## 2019-04-24 DIAGNOSIS — I129 Hypertensive chronic kidney disease with stage 1 through stage 4 chronic kidney disease, or unspecified chronic kidney disease: Secondary | ICD-10-CM | POA: Diagnosis not present

## 2019-04-24 DIAGNOSIS — E785 Hyperlipidemia, unspecified: Secondary | ICD-10-CM | POA: Diagnosis not present

## 2019-04-24 DIAGNOSIS — E1129 Type 2 diabetes mellitus with other diabetic kidney complication: Secondary | ICD-10-CM | POA: Diagnosis not present

## 2019-05-22 DIAGNOSIS — H9203 Otalgia, bilateral: Secondary | ICD-10-CM | POA: Diagnosis not present

## 2019-05-22 DIAGNOSIS — R1310 Dysphagia, unspecified: Secondary | ICD-10-CM | POA: Diagnosis not present

## 2019-05-22 DIAGNOSIS — R42 Dizziness and giddiness: Secondary | ICD-10-CM | POA: Diagnosis not present

## 2019-05-29 ENCOUNTER — Other Ambulatory Visit: Payer: Self-pay | Admitting: Otolaryngology

## 2019-05-29 DIAGNOSIS — R42 Dizziness and giddiness: Secondary | ICD-10-CM

## 2019-06-03 DIAGNOSIS — R1030 Lower abdominal pain, unspecified: Secondary | ICD-10-CM | POA: Diagnosis not present

## 2019-06-03 DIAGNOSIS — M47816 Spondylosis without myelopathy or radiculopathy, lumbar region: Secondary | ICD-10-CM | POA: Diagnosis not present

## 2019-06-03 DIAGNOSIS — M48062 Spinal stenosis, lumbar region with neurogenic claudication: Secondary | ICD-10-CM | POA: Diagnosis not present

## 2019-06-03 DIAGNOSIS — M7541 Impingement syndrome of right shoulder: Secondary | ICD-10-CM | POA: Diagnosis not present

## 2019-06-03 DIAGNOSIS — M47812 Spondylosis without myelopathy or radiculopathy, cervical region: Secondary | ICD-10-CM | POA: Diagnosis not present

## 2019-06-03 DIAGNOSIS — M7542 Impingement syndrome of left shoulder: Secondary | ICD-10-CM | POA: Diagnosis not present

## 2019-06-03 DIAGNOSIS — M461 Sacroiliitis, not elsewhere classified: Secondary | ICD-10-CM | POA: Diagnosis not present

## 2019-06-03 DIAGNOSIS — M961 Postlaminectomy syndrome, not elsewhere classified: Secondary | ICD-10-CM | POA: Diagnosis not present

## 2019-06-03 DIAGNOSIS — R42 Dizziness and giddiness: Secondary | ICD-10-CM | POA: Diagnosis not present

## 2019-06-19 DIAGNOSIS — M47812 Spondylosis without myelopathy or radiculopathy, cervical region: Secondary | ICD-10-CM | POA: Diagnosis not present

## 2019-06-24 ENCOUNTER — Other Ambulatory Visit: Payer: Self-pay

## 2019-06-24 ENCOUNTER — Ambulatory Visit
Admission: RE | Admit: 2019-06-24 | Discharge: 2019-06-24 | Disposition: A | Discharge status: Home / Self Care | Payer: BC Managed Care – PPO | Source: Ambulatory Visit | Attending: Otolaryngology | Admitting: Otolaryngology

## 2019-06-24 DIAGNOSIS — R42 Dizziness and giddiness: Secondary | ICD-10-CM

## 2019-06-24 MED ORDER — GADOBENATE DIMEGLUMINE 529 MG/ML IV SOLN
20.0000 mL | Freq: Once | INTRAVENOUS | Status: AC | PRN
  Administered 2019-06-24: 16:00:00 20 mL via INTRAVENOUS

## 2019-07-03 DIAGNOSIS — M47812 Spondylosis without myelopathy or radiculopathy, cervical region: Secondary | ICD-10-CM | POA: Diagnosis not present

## 2019-07-05 DIAGNOSIS — D751 Secondary polycythemia: Secondary | ICD-10-CM | POA: Diagnosis not present

## 2019-07-05 DIAGNOSIS — M5489 Other dorsalgia: Secondary | ICD-10-CM | POA: Diagnosis not present

## 2019-07-05 DIAGNOSIS — K429 Umbilical hernia without obstruction or gangrene: Secondary | ICD-10-CM | POA: Diagnosis not present

## 2019-07-05 DIAGNOSIS — R1032 Left lower quadrant pain: Secondary | ICD-10-CM | POA: Diagnosis not present

## 2019-08-14 DIAGNOSIS — N183 Chronic kidney disease, stage 3 unspecified: Secondary | ICD-10-CM | POA: Diagnosis not present

## 2019-08-14 DIAGNOSIS — I129 Hypertensive chronic kidney disease with stage 1 through stage 4 chronic kidney disease, or unspecified chronic kidney disease: Secondary | ICD-10-CM | POA: Diagnosis not present

## 2019-08-14 DIAGNOSIS — E039 Hypothyroidism, unspecified: Secondary | ICD-10-CM | POA: Diagnosis not present

## 2019-08-14 DIAGNOSIS — E1129 Type 2 diabetes mellitus with other diabetic kidney complication: Secondary | ICD-10-CM | POA: Diagnosis not present

## 2019-10-07 DIAGNOSIS — N200 Calculus of kidney: Secondary | ICD-10-CM | POA: Diagnosis not present

## 2019-10-31 DIAGNOSIS — M25551 Pain in right hip: Secondary | ICD-10-CM | POA: Diagnosis not present

## 2019-10-31 DIAGNOSIS — M25552 Pain in left hip: Secondary | ICD-10-CM | POA: Diagnosis not present

## 2019-12-11 DIAGNOSIS — I10 Essential (primary) hypertension: Secondary | ICD-10-CM | POA: Diagnosis not present

## 2019-12-11 DIAGNOSIS — Z6830 Body mass index (BMI) 30.0-30.9, adult: Secondary | ICD-10-CM | POA: Diagnosis not present

## 2019-12-11 DIAGNOSIS — M160 Bilateral primary osteoarthritis of hip: Secondary | ICD-10-CM | POA: Diagnosis not present

## 2020-01-08 DIAGNOSIS — M5413 Radiculopathy, cervicothoracic region: Secondary | ICD-10-CM | POA: Diagnosis not present

## 2020-03-24 DIAGNOSIS — M5413 Radiculopathy, cervicothoracic region: Secondary | ICD-10-CM | POA: Diagnosis not present

## 2020-04-17 IMAGING — CT CT ABD-PELV W/ CM
2 of 5 series · 15 of 46 positions shown, 17 images · IV contrast (APPLIED)
Comparison: 03/05/2010

CLINICAL DATA: 57-year-old male with a history of left lower
quadrant pain and nausea and vomiting

EXAM:
CT ABDOMEN AND PELVIS WITH CONTRAST
TECHNIQUE: Multidetector CT imaging of the abdomen and pelvis was performed
using the standard protocol following bolus administration of
intravenous contrast.
CONTRAST:  100mL [id number] IOPAMIDOL ([id number]) INJECTION 61%

[Series 3: abd/ pelvis 5.0 i30f 2 · axial · 0.96mm/px · z∈[+724,+1199]mm · 12 of 107 slices shown, 14 images]
[im 6/107  soft-tissue]
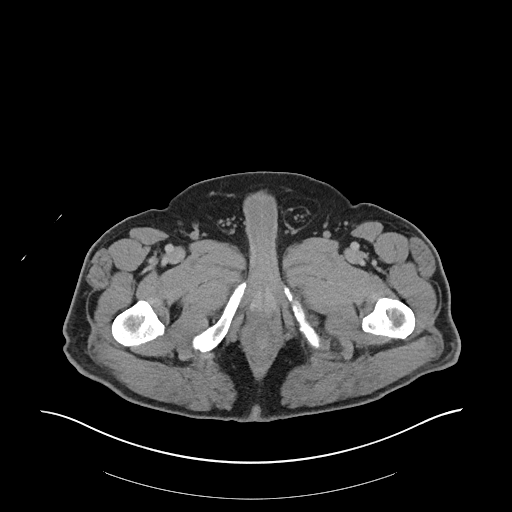
[im 6/107  bone]
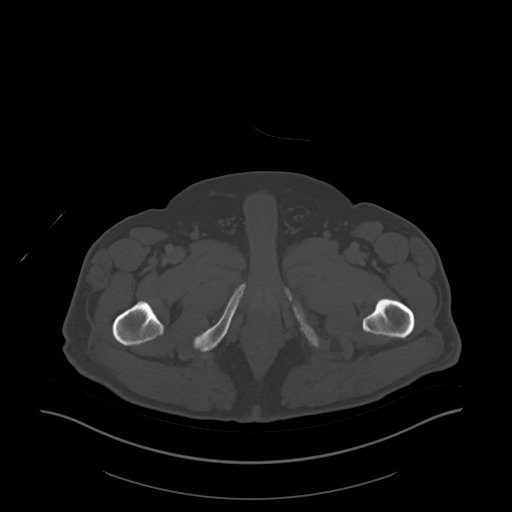
[im 17/107  soft-tissue]
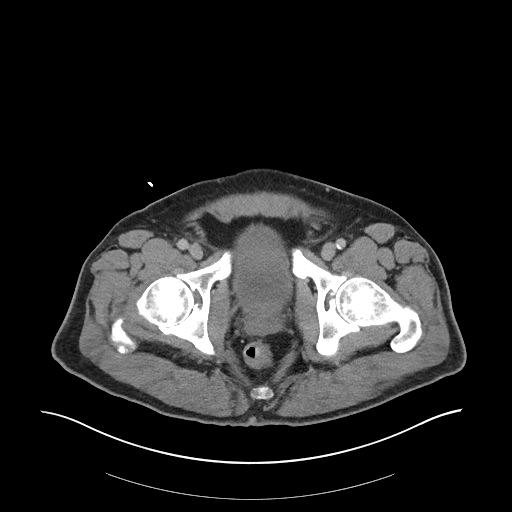
[im 23/107  soft-tissue]
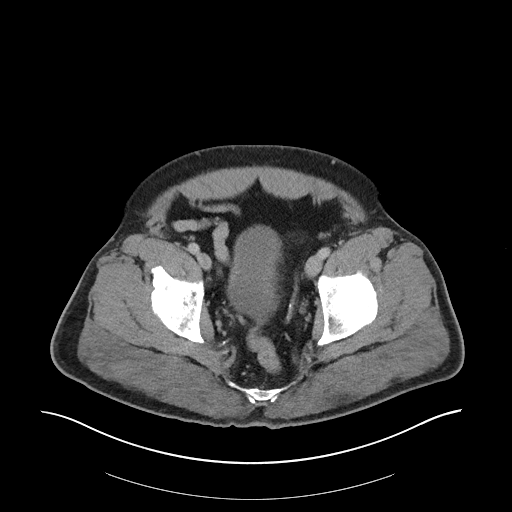
[im 34/107  soft-tissue]
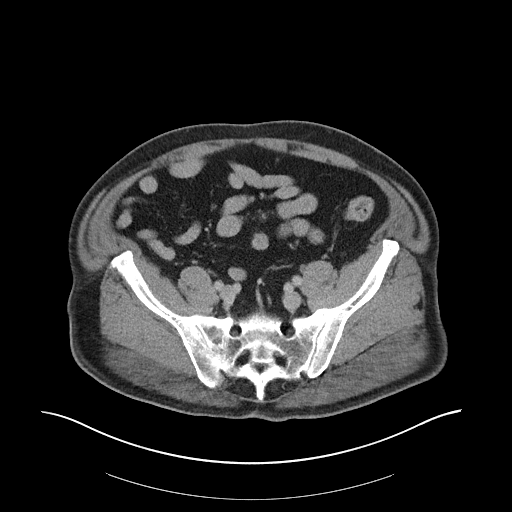
[im 40/107  soft-tissue]
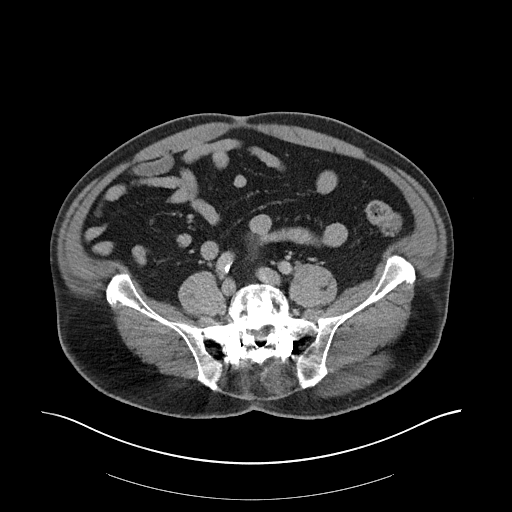
[im 51/107  soft-tissue]
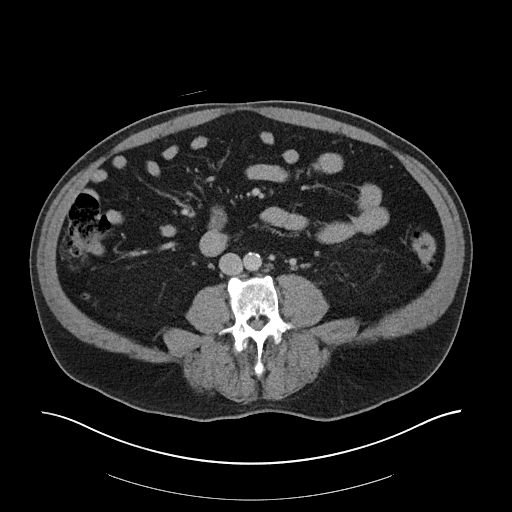
[im 56/107  soft-tissue]
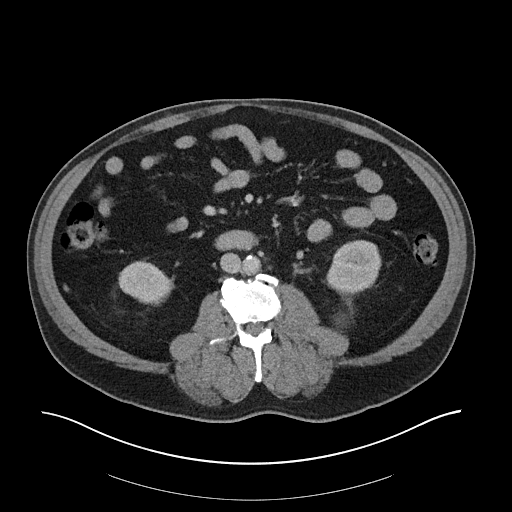
[im 67/107  soft-tissue]
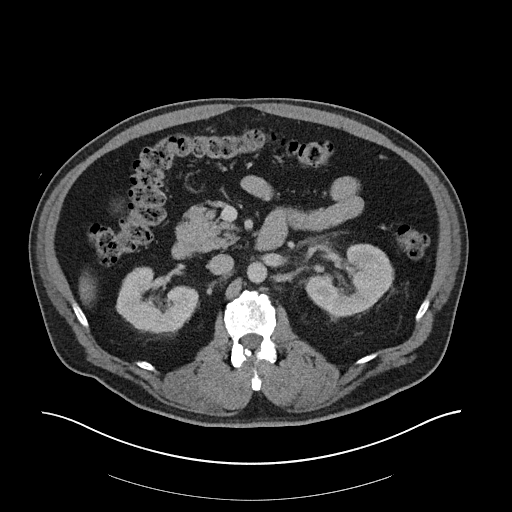
[im 73/107  soft-tissue]
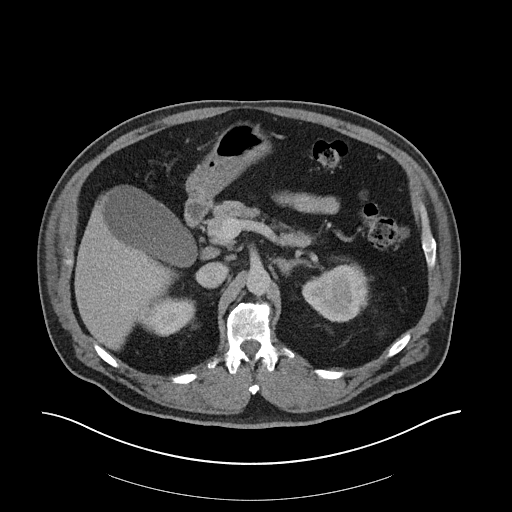
[im 73/107  bone]
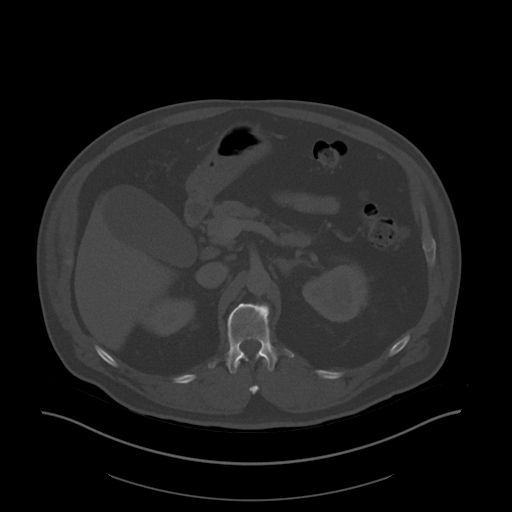
[im 84/107  soft-tissue]
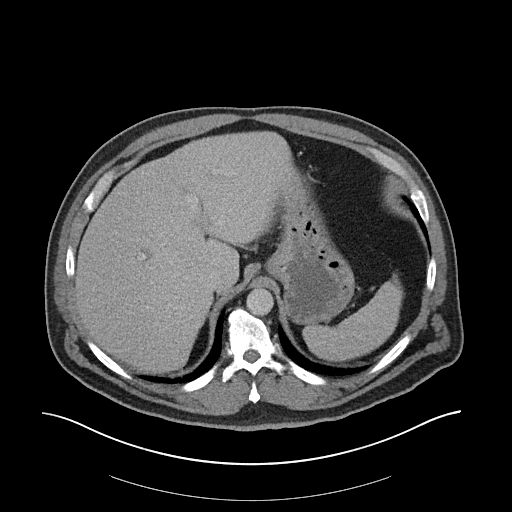
[im 90/107  soft-tissue]
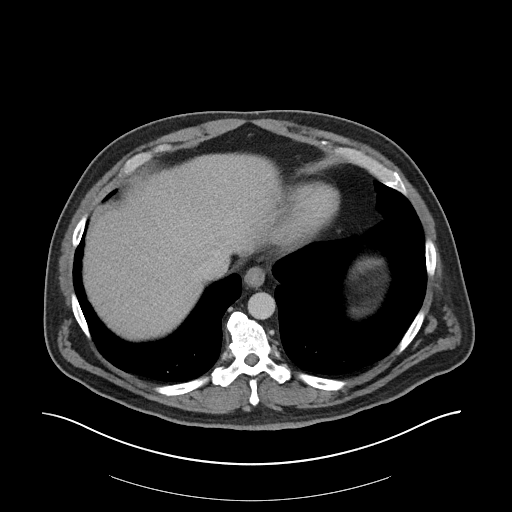
[im 101/107  soft-tissue]
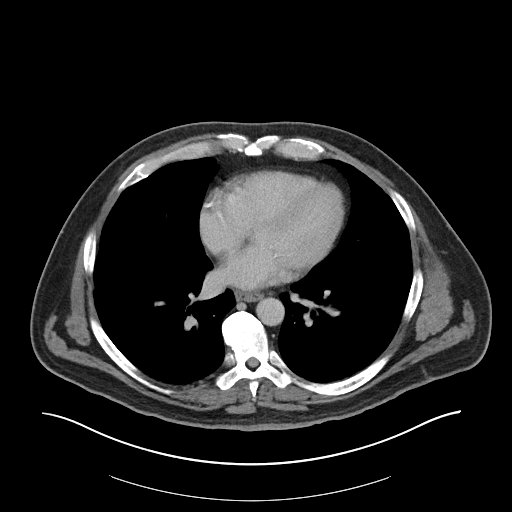

[Series 6: coronal soft tissue · coronal · 0.92mm/px · 3 of 115 slices shown]
[im 39/115  soft-tissue]
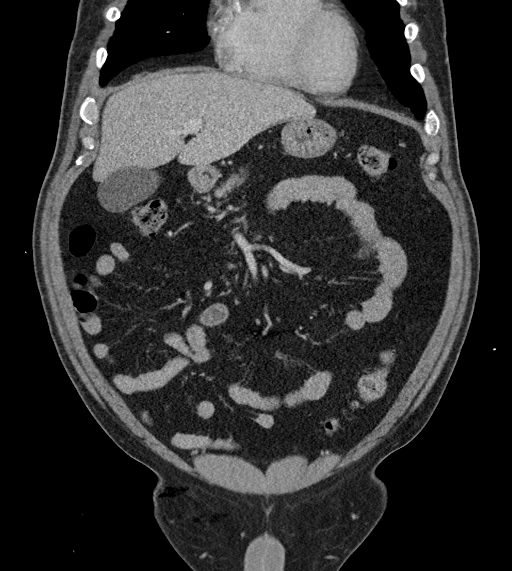
[im 51/115  soft-tissue]
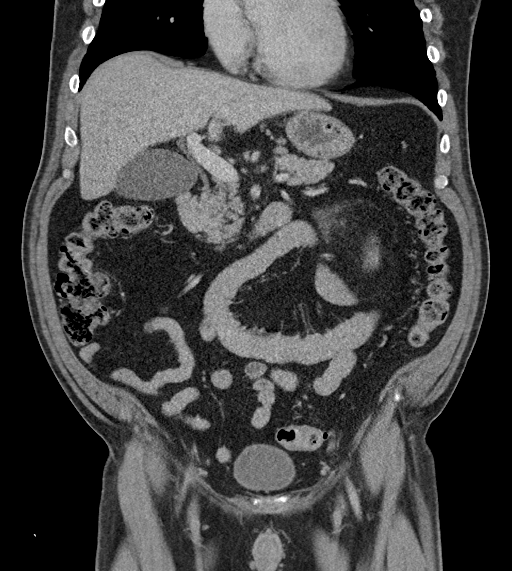
[im 64/115  soft-tissue]
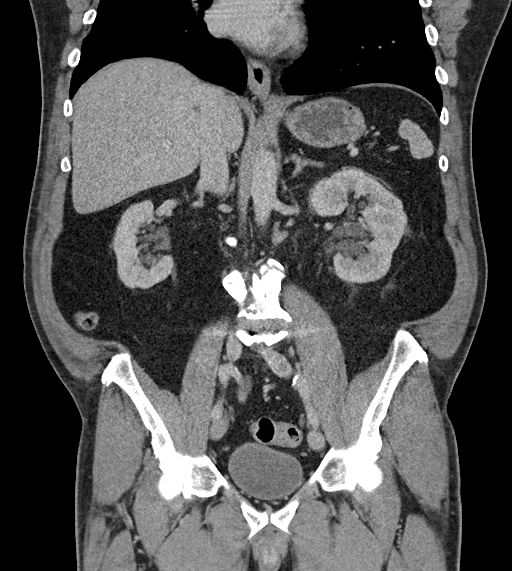

[15 of 46 positions shown; findings below may reference images not displayed]

FINDINGS: Lower chest: No acute finding of the lower chest.

Hepatobiliary: Unremarkable liver.  Unremarkable gallbladder.

Pancreas: Unremarkable pancreas

Spleen: Unremarkable spleen

Adrenals/Urinary Tract: Unremarkable bilateral adrenal glands.

Right kidney with 2 small nonobstructing stones in the collecting
system. No hydronephrosis. No right-sided perinephric stranding.
Nonenhancing low-density cystic structure of the superior medial
cortex of the right kidney compatible with Bosniak 1 cyst.
Unremarkable course the right ureter.

Left kidney demonstrates mild perinephric stranding with
inflammatory changes at the renal hilum in the region of the
parapelvic cysts. No hydronephrosis. Redemonstration of cyst on the
posterior cortex of the left kidney which has enlarged from the
comparison CT now measuring 5.6 cm. Mild inflammatory changes
adjacent to the proximal left ureter which is nondistended. There is
a left sided ureterovesical stone at the base of the bladder which
measures 3 mm.

Nonobstructive stone at the inferior left collecting system of the
kidney.

Stomach/Bowel: Unremarkable stomach. Unremarkable small bowel
without abnormal distention. No transition point no focal wall
thickening. Normal appendix. Mild stool burden. Colonic diverticular
disease without evidence of acute inflammation.

Vascular/Lymphatic: Atherosclerotic calcifications of the abdominal
aorta. No aneurysm. No periaortic fluid. Mild atherosclerosis of the
iliac arteries.

No adenopathy. Small lymph nodes in the periaortic nodal stations,
which were present on the prior.

Reproductive: Transverse diameter prostate measures 3.5 cm.

Other: Small fat containing umbilical hernia. Left sided inguinal
hernia contains fat.

Musculoskeletal: No acute displaced fracture. Surgical changes of
prior posterior lumbar interbody fusion with bilateral pedicle screw
and rod fixation of L5-S1. Degenerative changes of the hips.
IMPRESSION: Small stone at the left ureterovesical junction, without evidence of
hydronephrosis. There is inflammatory change at the left collecting
system and perinephric space. This may be reactive/inflammatory,
however, if there is concern for ascending urinary tract infection,
recommend correlation with urinalysis.

Additional bilateral small nonobstructing nephrolithiasis.

Diverticular disease without evidence of acute diverticulitis.

Aortic Atherosclerosis ([id number]).

## 2020-04-28 DIAGNOSIS — Z6830 Body mass index (BMI) 30.0-30.9, adult: Secondary | ICD-10-CM | POA: Diagnosis not present

## 2020-04-28 DIAGNOSIS — M160 Bilateral primary osteoarthritis of hip: Secondary | ICD-10-CM | POA: Diagnosis not present

## 2020-05-13 DIAGNOSIS — M5020 Other cervical disc displacement, unspecified cervical region: Secondary | ICD-10-CM | POA: Diagnosis not present

## 2020-05-13 DIAGNOSIS — M5412 Radiculopathy, cervical region: Secondary | ICD-10-CM | POA: Diagnosis not present

## 2020-08-06 ENCOUNTER — Other Ambulatory Visit: Payer: Self-pay | Admitting: Neurosurgery

## 2020-08-20 NOTE — Progress Notes (Signed)
Surgical Instructions    Your procedure is scheduled on June 7  Report to [facility] Main Entrance "A" at 0820 A.M., then check in with the Admitting office.  Call this number if you have problems the morning of surgery:  [phone]   If you have any questions prior to your surgery date call [phone]: Open Monday-Friday 8am-4pm    Remember:  Do not eat or drink after midnight the night before your surgery    Take these medicines the morning of surgery with A SIP OF WATER  ALPRAZolam (XANAX)  gabapentin (NEURONTIN)  levothyroxine (SYNTHROID)  metaxalone (SKELAXIN)  If needed for muscle spasms tamsulosin (FLOMAX) traMADol (ULTRAM) if needed for pain   As of today, STOP taking any Aspirin (unless otherwise instructed by your surgeon) Aleve, Naproxen, Ibuprofen, Motrin, Advil, Goody's, BC's, all herbal medications, fish oil, and all vitamins.  WHAT DO I DO ABOUT MY DIABETES MEDICATION?   Marland Kitchen Do not take oral diabetes medicines (pills) the morning of surgery. metFORMIN (GLUCOPHAGE)    HOW TO MANAGE YOUR DIABETES BEFORE AND AFTER SURGERY  Why is it important to control my blood sugar before and after surgery? . Improving blood sugar levels before and after surgery helps healing and can limit problems. . A way of improving blood sugar control is eating a healthy diet by: o  Eating less sugar and carbohydrates o  Increasing activity/exercise o  Talking with your doctor about reaching your blood sugar goals . High blood sugars (greater than 180 mg/dL) can raise your risk of infections and slow your recovery, so you will need to focus on controlling your diabetes during the weeks before surgery. . Make sure that the doctor who takes care of your diabetes knows about your planned surgery including the date and location.  How do I manage my blood sugar before surgery? . Check your blood sugar at least 4 times a day, starting 2 days before surgery, to make sure that the level is  not too high or low.  . Check your blood sugar the morning of your surgery when you wake up and every 2 hours until you get to the Short Stay unit.  o If your blood sugar is less than 70 mg/dL, you will need to treat for low blood sugar: - Do not take insulin. - Treat a low blood sugar (less than 70 mg/dL) with  cup of clear juice (cranberry or apple), 4 glucose tablets, OR glucose gel. - Recheck blood sugar in 15 minutes after treatment (to make sure it is greater than 70 mg/dL). If your blood sugar is not greater than 70 mg/dL on recheck, call [phone] for further instructions. . Report your blood sugar to the short stay nurse when you get to Short Stay.  . If you are admitted to the hospital after surgery: o Your blood sugar will be checked by the staff and you will probably be given insulin after surgery (instead of oral diabetes medicines) to make sure you have good blood sugar levels. o The goal for blood sugar control after surgery is 80-180 mg/dL.          Do not wear jewelry or makeup Do not wear lotions, powders, perfumes/colognes, or deodorant. Do not shave 48 hours prior to surgery.  Men may shave face and neck. Do not bring valuables to the hospital. DO Not wear nail polish, gel polish, artificial nails, or any other type of covering on  natural nails including finger and toenails. If patients  have artificial nails, gel coating, etc. that need to be removed by a nail salon please have this removed prior to surgery or surgery may need to be canceled/delayed if the surgeon/ anesthesia feels like the patient is unable to be adequately monitored.             [facility] is not responsible for any belongings or valuables.  Do NOT Smoke (Tobacco/Vaping) or drink Alcohol 24 hours prior to your procedure If you use a CPAP at night, you may bring all equipment for your overnight stay.   Contacts, glasses, dentures or bridgework may not be worn into surgery, please bring cases for  these belongings   For patients admitted to the hospital, discharge time will be determined by your treatment team.   Patients discharged the day of surgery will not be allowed to drive home, and someone needs to stay with them for 24 hours.    Special instructions:    Oral Hygiene is also important to reduce your risk of infection.  Remember - BRUSH YOUR TEETH THE MORNING OF SURGERY WITH YOUR REGULAR TOOTHPASTE   [place]- Preparing For Surgery  Before surgery, you can play an important role. Because skin is not sterile, your skin needs to be as free of germs as possible. You can reduce the number of germs on your skin by washing with CHG (chlorahexidine gluconate) Soap before surgery.  CHG is an antiseptic cleaner which kills germs and bonds with the skin to continue killing germs even after washing.     Please do not use if you have an allergy to CHG or antibacterial soaps. If your skin becomes reddened/irritated stop using the CHG.  Do not shave (including legs and underarms) for at least 48 hours prior to first CHG shower. It is OK to shave your face.  Please follow these instructions carefully.    1.  Shower the NIGHT BEFORE SURGERY and the MORNING OF SURGERY with CHG Soap.   If you chose to wash your hair, wash your hair first as usual with your normal shampoo. After you shampoo, rinse your hair and body thoroughly to remove the shampoo.  Then [employer] and genitals (private parts) with your normal soap and rinse thoroughly to remove soap.  2. After that Use CHG Soap as you would any other liquid soap. You can apply CHG directly to the skin and wash gently with a scrungie or a clean washcloth.   3. Apply the CHG Soap to your body ONLY FROM THE NECK DOWN.  Do not use on open wounds or open sores. Avoid contact with your eyes, ears, mouth and genitals (private parts). Wash Face and genitals (private parts)  with your normal soap.   4. Wash thoroughly, paying special attention  to the area where your surgery will be performed.  5. Thoroughly rinse your body with warm water from the neck down.  6. DO NOT shower/wash with your normal soap after using and rinsing off the CHG Soap.  7. Pat yourself dry with a CLEAN TOWEL.  8. Wear CLEAN PAJAMAS to bed the night before surgery  9. Place CLEAN SHEETS on your bed the night before your surgery  10. DO NOT SLEEP WITH PETS.   Day of Surgery:  Take a shower with CHG soap. Wear Clean/Comfortable clothing the morning of surgery Do not apply any deodorants/lotions.   Remember to brush your teeth WITH YOUR REGULAR TOOTHPASTE.   Please read over the following fact sheets that  you were given.

## 2020-08-21 ENCOUNTER — Other Ambulatory Visit: Payer: Self-pay

## 2020-08-21 ENCOUNTER — Encounter (HOSPITAL_COMMUNITY): Payer: Self-pay

## 2020-08-21 ENCOUNTER — Encounter (HOSPITAL_COMMUNITY)
Admission: RE | Admit: 2020-08-21 | Discharge: 2020-08-21 | Disposition: A | Discharge status: Home / Self Care | Payer: Medicare Other | Source: Ambulatory Visit | Attending: Neurosurgery | Admitting: Neurosurgery

## 2020-08-21 DIAGNOSIS — I10 Essential (primary) hypertension: Secondary | ICD-10-CM | POA: Diagnosis not present

## 2020-08-21 DIAGNOSIS — Z20822 Contact with and (suspected) exposure to covid-19: Secondary | ICD-10-CM | POA: Diagnosis not present

## 2020-08-21 DIAGNOSIS — E119 Type 2 diabetes mellitus without complications: Secondary | ICD-10-CM | POA: Diagnosis not present

## 2020-08-21 DIAGNOSIS — Z01818 Encounter for other preprocedural examination: Secondary | ICD-10-CM | POA: Insufficient documentation

## 2020-08-21 HISTORY — DX: Unspecified asthma, uncomplicated: J45.909

## 2020-08-21 HISTORY — DX: Hypothyroidism, unspecified: E03.9

## 2020-08-21 HISTORY — DX: Fatty (change of) liver, not elsewhere classified: K76.0

## 2020-08-21 LAB — COMPREHENSIVE METABOLIC PANEL
ALT: 22 U/L (ref 0–44)
AST: 19 U/L (ref 15–41)
Albumin: 4.1 g/dL (ref 3.5–5.0)
Alkaline Phosphatase: 64 U/L (ref 38–126)
Anion gap: 5 (ref 5–15)
BUN: 18 mg/dL (ref 6–20)
CO2: 29 mmol/L (ref 22–32)
Calcium: 9.8 mg/dL (ref 8.9–10.3)
Chloride: 105 mmol/L (ref 98–111)
Creatinine, Ser: 1.11 mg/dL (ref 0.61–1.24)
GFR, Estimated: >60 mL/min (ref >60)
Glucose, Bld: 110 mg/dL — ABNORMAL HIGH (ref 70–99)
Potassium: 3.9 mmol/L (ref 3.5–5.1)
Sodium: 139 mmol/L (ref 135–145)
Total Bilirubin: 1.4 mg/dL — ABNORMAL HIGH (ref 0.3–1.2)
Total Protein: 7.3 g/dL (ref 6.5–8.1)

## 2020-08-21 LAB — CBC WITH DIFFERENTIAL/PLATELET
Abs Immature Granulocytes: 0.03 10*3/uL (ref 0.00–0.07)
Basophils Absolute: 0.1 10*3/uL (ref 0.0–0.1)
Basophils Relative: 1 %
Eosinophils Absolute: 0.2 10*3/uL (ref 0.0–0.5)
Eosinophils Relative: 3 %
HCT: 48.7 % (ref 39.0–52.0)
Hemoglobin: 16.4 g/dL (ref 13.0–17.0)
Immature Granulocytes: 0 %
Lymphocytes Relative: 19 %
Lymphs Abs: 1.5 10*3/uL (ref 0.7–4.0)
MCH: 34.7 pg — ABNORMAL HIGH (ref 26.0–34.0)
MCHC: 33.7 g/dL (ref 30.0–36.0)
MCV: 103 fL — ABNORMAL HIGH (ref 80.0–100.0)
Monocytes Absolute: 0.7 10*3/uL (ref 0.1–1.0)
Monocytes Relative: 9 %
Neutro Abs: 5.3 10*3/uL (ref 1.7–7.7)
Neutrophils Relative %: 68 %
Platelets: 202 10*3/uL (ref 150–400)
RBC: 4.73 MIL/uL (ref 4.22–5.81)
RDW: 12.1 % (ref 11.5–15.5)
WBC: 7.8 10*3/uL (ref 4.0–10.5)
nRBC: 0 % (ref 0.0–0.2)

## 2020-08-21 LAB — HEMOGLOBIN A1C
Hgb A1c MFr Bld: 5.7 % — ABNORMAL HIGH (ref 4.8–5.6)
Mean Plasma Glucose: 117 mg/dL

## 2020-08-21 LAB — SARS CORONAVIRUS 2 (TAT 6-24 HRS): SARS Coronavirus 2: NEGATIVE

## 2020-08-21 LAB — TYPE AND SCREEN
ABO/RH(D): O POS
Antibody Screen: NEGATIVE

## 2020-08-21 LAB — GLUCOSE, CAPILLARY: Glucose-Capillary: 104 mg/dL — ABNORMAL HIGH (ref 70–99)

## 2020-08-21 LAB — SURGICAL PCR SCREEN
MRSA, PCR: NEGATIVE
Staphylococcus aureus: NEGATIVE

## 2020-08-21 NOTE — Progress Notes (Signed)
PCP - Geoffry Paradise, MD Cardiologist - Denies  PPM/ICD - Denies  Chest x-ray - N/A EKG - 08/21/20 Stress Test - Per pt, > 10 years ago, results were negative ECHO - Denies Cardiac Cath - Denies  Sleep Study - Yes, positive for OSA CPAP - No  Fasting Blood Sugar 110-120 Checks Blood Sugar x1 weekly  Blood Thinner Instructions: N/A Aspirin Instructions: N/A  ERAS Protcol - N/A PRE-SURGERY Ensure or G2- N/A  COVID TEST- 08/21/20   Anesthesia review: No  Patient denies shortness of breath, fever, cough and chest pain at PAT appointment   All instructions explained to the patient, with a verbal understanding of the material. Patient agrees to go over the instructions while at home for a better understanding. Patient also instructed to self quarantine after being tested for COVID-19. The opportunity to ask questions was provided.

## 2020-09-10 NOTE — Progress Notes (Signed)
Surgical Instructions    Your procedure is scheduled on June 27  Report to [facility] Main Entrance "A" at 0600 A.M., then check in with the Admitting office.  Call this number if you have problems the morning of surgery:  [phone]   If you have any questions prior to your surgery date call [phone]: Open Monday-Friday 8am-4pm    Remember:  Do not eat or drink after midnight the night before your surgery    Take these medicines the morning of surgery with A SIP OF WATER  ALPRAZolam (XANAX)  gabapentin (NEURONTIN)  levothyroxine (SYNTHROID)  metaxalone (SKELAXIN)  If needed for muscle spasms tamsulosin (FLOMAX) traMADol (ULTRAM) if needed for pain   As of today, STOP taking any Aspirin (unless otherwise instructed by your surgeon) Aleve, Naproxen, Ibuprofen, Motrin, Advil, Goody's, BC's, all herbal medications, fish oil, and all vitamins.  WHAT DO I DO ABOUT MY DIABETES MEDICATION?   Do not take oral diabetes medicines (pills) the morning of surgery. metFORMIN (GLUCOPHAGE)    HOW TO MANAGE YOUR DIABETES BEFORE AND AFTER SURGERY  Why is it important to control my blood sugar before and after surgery? Improving blood sugar levels before and after surgery helps healing and can limit problems. A way of improving blood sugar control is eating a healthy diet by:  Eating less sugar and carbohydrates  Increasing activity/exercise  Talking with your doctor about reaching your blood sugar goals High blood sugars (greater than 180 mg/dL) can raise your risk of infections and slow your recovery, so you will need to focus on controlling your diabetes during the weeks before surgery. Make sure that the doctor who takes care of your diabetes knows about your planned surgery including the date and location.  How do I manage my blood sugar before surgery? Check your blood sugar at least 4 times a day, starting 2 days before surgery, to make sure that the level is not too high or  low.  Check your blood sugar the morning of your surgery when you wake up and every 2 hours until you get to the Short Stay unit.  If your blood sugar is less than 70 mg/dL, you will need to treat for low blood sugar: Do not take insulin. Treat a low blood sugar (less than 70 mg/dL) with  cup of clear juice (cranberry or apple), 4 glucose tablets, OR glucose gel. Recheck blood sugar in 15 minutes after treatment (to make sure it is greater than 70 mg/dL). If your blood sugar is not greater than 70 mg/dL on recheck, call [phone] for further instructions. Report your blood sugar to the short stay nurse when you get to Short Stay.  If you are admitted to the hospital after surgery: Your blood sugar will be checked by the staff and you will probably be given insulin after surgery (instead of oral diabetes medicines) to make sure you have good blood sugar levels. The goal for blood sugar control after surgery is 80-180 mg/dL.          Do not wear jewelry or makeup Do not wear lotions, powders, perfumes/colognes, or deodorant. Men may shave face and neck. Do not bring valuables to the hospital. DO Not wear nail polish, gel polish, artificial nails, or any other type of covering on natural nails including finger and toenails. If patients have artificial nails, gel coating, etc. that need to be removed by a nail salon please have this removed prior to surgery or surgery may need to be  canceled/delayed if the surgeon/ anesthesia feels like the patient is unable to be adequately monitored.             [place] is not responsible for any belongings or valuables.  Do NOT Smoke (Tobacco/Vaping) or drink Alcohol 24 hours prior to your procedure If you use a CPAP at night, you may bring all equipment for your overnight stay.   Contacts, glasses, dentures or bridgework may not be worn into surgery, please bring cases for these belongings   For patients admitted to the hospital, discharge time  will be determined by your treatment team.   Patients discharged the day of surgery will not be allowed to drive home, and someone needs to stay with them for 24 hours.    Special instructions:    Oral Hygiene is also important to reduce your risk of infection.  Remember - BRUSH YOUR TEETH THE MORNING OF SURGERY WITH YOUR REGULAR TOOTHPASTE   [place]- Preparing For Surgery  Before surgery, you can play an important role. Because skin is not sterile, your skin needs to be as free of germs as possible. You can reduce the number of germs on your skin by washing with CHG (chlorahexidine gluconate) Soap before surgery.  CHG is an antiseptic cleaner which kills germs and bonds with the skin to continue killing germs even after washing.     Please do not use if you have an allergy to CHG or antibacterial soaps. If your skin becomes reddened/irritated stop using the CHG.  Do not shave (including legs and underarms) for at least 48 hours prior to first CHG shower. It is OK to shave your face.  Please follow these instructions carefully.     Shower the NIGHT BEFORE SURGERY and the MORNING OF SURGERY with CHG Soap.   If you chose to wash your hair, wash your hair first as usual with your normal shampoo. After you shampoo, rinse your hair and body thoroughly to remove the shampoo.  Then [employer] and genitals (private parts) with your normal soap and rinse thoroughly to remove soap.  After that Use CHG Soap as you would any other liquid soap. You can apply CHG directly to the skin and wash gently with a scrungie or a clean washcloth.   Apply the CHG Soap to your body ONLY FROM THE NECK DOWN.  Do not use on open wounds or open sores. Avoid contact with your eyes, ears, mouth and genitals (private parts). Wash Face and genitals (private parts)  with your normal soap.   Wash thoroughly, paying special attention to the area where your surgery will be performed.  Thoroughly rinse your body with  warm water from the neck down.  DO NOT shower/wash with your normal soap after using and rinsing off the CHG Soap.  Pat yourself dry with a CLEAN TOWEL.  Wear CLEAN PAJAMAS to bed the night before surgery  Place CLEAN SHEETS on your bed the night before your surgery  DO NOT SLEEP WITH PETS.   Day of Surgery: Take a shower with CHG soap. Wear Clean/Comfortable clothing the morning of surgery Do not apply any deodorants/lotions.   Remember to brush your teeth WITH YOUR REGULAR TOOTHPASTE.   Please read over the following fact sheets that you were given.

## 2020-09-10 NOTE — Progress Notes (Signed)
Attempted to call patient and give pre op instructions over phone. He stated he didn't have time to talk at the moment. I will forward information about previous pre op appt with PAT nurse tomorrow. Patient will need another COVID test scheduled prior to this surgery which can be done at the drive thru. No extra labs needed at pre op appt.

## 2020-09-11 ENCOUNTER — Encounter (HOSPITAL_COMMUNITY)
Admission: RE | Admit: 2020-09-11 | Discharge: 2020-09-11 | Disposition: A | Discharge status: Home / Self Care | Payer: Medicare Other | Source: Ambulatory Visit | Attending: Neurosurgery | Admitting: Neurosurgery

## 2020-09-11 ENCOUNTER — Encounter (HOSPITAL_COMMUNITY): Payer: Self-pay

## 2020-09-11 ENCOUNTER — Other Ambulatory Visit: Payer: Self-pay

## 2020-09-11 DIAGNOSIS — Z01812 Encounter for preprocedural laboratory examination: Secondary | ICD-10-CM | POA: Insufficient documentation

## 2020-09-11 DIAGNOSIS — U071 COVID-19: Secondary | ICD-10-CM | POA: Insufficient documentation

## 2020-09-11 LAB — SARS CORONAVIRUS 2 (TAT 6-24 HRS): SARS Coronavirus 2: POSITIVE — AB

## 2020-09-11 LAB — GLUCOSE, CAPILLARY: Glucose-Capillary: 124 mg/dL — ABNORMAL HIGH (ref 70–99)

## 2020-09-11 NOTE — Progress Notes (Signed)
Spoke with Erie Noe at Dr. Lindalou Hose office.  She is faxing a note stating patient was Covid positive on 08/23/20.

## 2020-09-11 NOTE — Progress Notes (Signed)
PCP - Geoffry Paradise, MD Cardiologist - Denies  PPM/ICD - Denies  Chest x-ray - N/A EKG - 08/21/20 Stress Test - Per pt, > 10 years ago, results were negative ECHO - Denies Cardiac Cath - Denies  Sleep Study - Yes, positive for OSA CPAP - No  Fasting Blood Sugar 110-120 Checks Blood Sugar x1 weekly  Blood Thinner Instructions: N/A Aspirin Instructions: N/A  ERAS Protcol - N/A PRE-SURGERY Ensure or G2- N/A  COVID TEST- 09/11/20.  Spoke to [facility]  Patient was scheduled for this surgery 08/25/20, but had positive home covid test 6/5.  Surgery was rescheduled till 09/14/20.  I will call dr. Lindalou Hose office to try and get a note stating he had a positive test in case this covid test comes back positive.    Anesthesia review: No  Patient denies shortness of breath, fever, cough and chest pain at PAT appointment   All instructions explained to the patient, with a verbal understanding of the material. Patient agrees to go over the instructions while at home for a better understanding. Patient also instructed to self quarantine after being tested for COVID-19. The opportunity to ask questions was provided.

## 2020-09-13 NOTE — Anesthesia Preprocedure Evaluation (Addendum)
Anesthesia Evaluation  Patient identified by MRN, date of birth, ID band Patient awake    Reviewed: Allergy & Precautions, H&P , NPO status , Patient's Chart, lab work & pertinent test results  Airway Mallampati: III  TM Distance: >3 FB Neck ROM: Full    Dental no notable dental hx. (+) Teeth Intact, Dental Advisory Given   Pulmonary asthma , sleep apnea , former smoker,    Pulmonary exam normal breath sounds clear to auscultation       Cardiovascular Exercise Tolerance: Good hypertension, Pt. on medications  Rhythm:Regular Rate:Normal     Neuro/Psych negative neurological ROS  negative psych ROS   GI/Hepatic negative GI ROS, Neg liver ROS,   Endo/Other  diabetes, Type 2, Oral Hypoglycemic AgentsHypothyroidism   Renal/GU negative Renal ROS  negative genitourinary   Musculoskeletal  (+) Arthritis , Osteoarthritis,    Abdominal   Peds  Hematology negative hematology ROS (+)   Anesthesia Other Findings   Reproductive/Obstetrics negative OB ROS                            Anesthesia Physical Anesthesia Plan  ASA: 3  Anesthesia Plan: General   Post-op Pain Management:    Induction: Intravenous  PONV Risk Score and Plan: 3 and Ondansetron, Dexamethasone and Midazolam  Airway Management Planned: Oral ETT  Additional Equipment:   Intra-op Plan:   Post-operative Plan: Extubation in OR  Informed Consent: I have reviewed the patients History and Physical, chart, labs and discussed the procedure including the risks, benefits and alternatives for the proposed anesthesia with the patient or authorized representative who has indicated his/her understanding and acceptance.     Dental advisory given  Plan Discussed with: CRNA  Anesthesia Plan Comments:        Anesthesia Quick Evaluation

## 2020-09-14 ENCOUNTER — Inpatient Hospital Stay (HOSPITAL_COMMUNITY): Payer: Medicare Other | Admitting: Vascular Surgery

## 2020-09-14 ENCOUNTER — Inpatient Hospital Stay (HOSPITAL_COMMUNITY)
Admission: RE | Admit: 2020-09-14 | Discharge: 2020-09-15 | DRG: 473 | Disposition: A | Discharge status: Home / Self Care | Payer: Medicare Other | Attending: Neurosurgery | Admitting: Neurosurgery

## 2020-09-14 ENCOUNTER — Inpatient Hospital Stay (HOSPITAL_COMMUNITY): Payer: Medicare Other | Admitting: Anesthesiology

## 2020-09-14 ENCOUNTER — Inpatient Hospital Stay (HOSPITAL_COMMUNITY)
Admission: RE | Disposition: A | Discharge status: Home / Self Care | Payer: Self-pay | Source: Home / Self Care | Attending: Neurosurgery

## 2020-09-14 ENCOUNTER — Other Ambulatory Visit: Payer: Self-pay

## 2020-09-14 ENCOUNTER — Inpatient Hospital Stay (HOSPITAL_COMMUNITY): Payer: Medicare Other

## 2020-09-14 ENCOUNTER — Encounter (HOSPITAL_COMMUNITY): Payer: Self-pay | Admitting: Neurosurgery

## 2020-09-14 DIAGNOSIS — E119 Type 2 diabetes mellitus without complications: Secondary | ICD-10-CM | POA: Diagnosis present

## 2020-09-14 DIAGNOSIS — M4812 Ankylosing hyperostosis [Forestier], cervical region: Secondary | ICD-10-CM | POA: Diagnosis present

## 2020-09-14 DIAGNOSIS — I10 Essential (primary) hypertension: Secondary | ICD-10-CM | POA: Diagnosis present

## 2020-09-14 DIAGNOSIS — Z8042 Family history of malignant neoplasm of prostate: Secondary | ICD-10-CM

## 2020-09-14 DIAGNOSIS — M4802 Spinal stenosis, cervical region: Secondary | ICD-10-CM | POA: Diagnosis present

## 2020-09-14 DIAGNOSIS — E039 Hypothyroidism, unspecified: Secondary | ICD-10-CM | POA: Diagnosis present

## 2020-09-14 DIAGNOSIS — Z87442 Personal history of urinary calculi: Secondary | ICD-10-CM | POA: Diagnosis not present

## 2020-09-14 DIAGNOSIS — Z8616 Personal history of COVID-19: Secondary | ICD-10-CM

## 2020-09-14 DIAGNOSIS — J45909 Unspecified asthma, uncomplicated: Secondary | ICD-10-CM | POA: Diagnosis present

## 2020-09-14 DIAGNOSIS — Z79890 Hormone replacement therapy (postmenopausal): Secondary | ICD-10-CM

## 2020-09-14 DIAGNOSIS — M4722 Other spondylosis with radiculopathy, cervical region: Secondary | ICD-10-CM | POA: Diagnosis present

## 2020-09-14 DIAGNOSIS — K760 Fatty (change of) liver, not elsewhere classified: Secondary | ICD-10-CM | POA: Diagnosis present

## 2020-09-14 DIAGNOSIS — Z7984 Long term (current) use of oral hypoglycemic drugs: Secondary | ICD-10-CM | POA: Diagnosis not present

## 2020-09-14 DIAGNOSIS — Z419 Encounter for procedure for purposes other than remedying health state, unspecified: Secondary | ICD-10-CM

## 2020-09-14 DIAGNOSIS — Z87891 Personal history of nicotine dependence: Secondary | ICD-10-CM | POA: Diagnosis not present

## 2020-09-14 DIAGNOSIS — M2578 Osteophyte, vertebrae: Secondary | ICD-10-CM | POA: Diagnosis present

## 2020-09-14 DIAGNOSIS — Z801 Family history of malignant neoplasm of trachea, bronchus and lung: Secondary | ICD-10-CM | POA: Diagnosis not present

## 2020-09-14 DIAGNOSIS — M4712 Other spondylosis with myelopathy, cervical region: Secondary | ICD-10-CM | POA: Diagnosis present

## 2020-09-14 DIAGNOSIS — M1990 Unspecified osteoarthritis, unspecified site: Secondary | ICD-10-CM | POA: Diagnosis present

## 2020-09-14 DIAGNOSIS — Z79899 Other long term (current) drug therapy: Secondary | ICD-10-CM

## 2020-09-14 DIAGNOSIS — R1310 Dysphagia, unspecified: Secondary | ICD-10-CM | POA: Diagnosis present

## 2020-09-14 HISTORY — PX: ANTERIOR CERVICAL DECOMP/DISCECTOMY FUSION: SHX1161

## 2020-09-14 LAB — GLUCOSE, CAPILLARY
Glucose-Capillary: 118 mg/dL — ABNORMAL HIGH (ref 70–99)
Glucose-Capillary: 140 mg/dL — ABNORMAL HIGH (ref 70–99)
Glucose-Capillary: 173 mg/dL — ABNORMAL HIGH (ref 70–99)
Glucose-Capillary: 128 mg/dL — ABNORMAL HIGH (ref 70–99)

## 2020-09-14 LAB — TYPE AND SCREEN
ABO/RH(D): O POS
Antibody Screen: NEGATIVE

## 2020-09-14 SURGERY — ANTERIOR CERVICAL DECOMPRESSION/DISCECTOMY FUSION 3 LEVELS
Anesthesia: General | Site: Spine Cervical

## 2020-09-14 MED ORDER — ALBUMIN HUMAN 5 % IV SOLN: INTRAVENOUS | Status: DC | PRN

## 2020-09-14 MED ORDER — PHENYLEPHRINE HCL-NACL 10-0.9 MG/250ML-% IV SOLN
INTRAVENOUS | Status: DC | PRN
  Administered 2020-09-14: 40 ug/min via INTRAVENOUS

## 2020-09-14 MED ORDER — PROPOFOL 10 MG/ML IV BOLUS
INTRAVENOUS | Status: DC | PRN
  Administered 2020-09-14: 120 mg via INTRAVENOUS

## 2020-09-14 MED ORDER — ONDANSETRON HCL 4 MG PO TABS: 4.0000 mg | ORAL_TABLET | Freq: Four times a day (QID) | ORAL | Status: DC | PRN

## 2020-09-14 MED ORDER — CYCLOBENZAPRINE HCL 10 MG PO TABS
10.0000 mg | ORAL_TABLET | Freq: Three times a day (TID) | ORAL | Status: DC | PRN
  Administered 2020-09-14: 10 mg via ORAL

## 2020-09-14 MED ORDER — TRAMADOL HCL 50 MG PO TABS: 50.0000 mg | ORAL_TABLET | Freq: Four times a day (QID) | ORAL | Status: DC | PRN

## 2020-09-14 MED ORDER — MIDAZOLAM HCL 2 MG/2ML IJ SOLN
INTRAMUSCULAR | Status: AC
  Filled 2020-09-14: qty 2

## 2020-09-14 MED ORDER — CHLORHEXIDINE GLUCONATE CLOTH 2 % EX PADS: 6.0000 | MEDICATED_PAD | Freq: Once | CUTANEOUS | Status: DC

## 2020-09-14 MED ORDER — HYDROCODONE-ACETAMINOPHEN 10-325 MG PO TABS
2.0000 | ORAL_TABLET | ORAL | Status: DC | PRN
  Administered 2020-09-14: 2 via ORAL
  Administered 2020-09-14: 1 via ORAL
  Administered 2020-09-14 – 2020-09-15 (×4): 2 via ORAL
  Filled 2020-09-14 (×5): qty 2

## 2020-09-14 MED ORDER — THROMBIN 20000 UNITS EX SOLR: CUTANEOUS | Status: DC | PRN

## 2020-09-14 MED ORDER — HYDROMORPHONE HCL 1 MG/ML IJ SOLN
INTRAMUSCULAR | Status: AC
  Filled 2020-09-14: qty 1

## 2020-09-14 MED ORDER — FENTANYL CITRATE (PF) 250 MCG/5ML IJ SOLN
INTRAMUSCULAR | Status: AC
  Filled 2020-09-14: qty 5

## 2020-09-14 MED ORDER — SUCCINYLCHOLINE CHLORIDE 200 MG/10ML IV SOSY
PREFILLED_SYRINGE | INTRAVENOUS | Status: DC | PRN
  Administered 2020-09-14: 100 mg via INTRAVENOUS

## 2020-09-14 MED ORDER — ONDANSETRON HCL 4 MG/2ML IJ SOLN: 4.0000 mg | Freq: Four times a day (QID) | INTRAMUSCULAR | Status: DC | PRN

## 2020-09-14 MED ORDER — SODIUM CHLORIDE 0.9 % IV SOLN: 250.0000 mL | INTRAVENOUS | Status: DC

## 2020-09-14 MED ORDER — FENTANYL CITRATE (PF) 250 MCG/5ML IJ SOLN
INTRAMUSCULAR | Status: DC | PRN
  Administered 2020-09-14: 50 ug via INTRAVENOUS
  Administered 2020-09-14: 25 ug via INTRAVENOUS
  Administered 2020-09-14: 100 ug via INTRAVENOUS
  Administered 2020-09-14: 50 ug via INTRAVENOUS
  Administered 2020-09-14: 25 ug via INTRAVENOUS

## 2020-09-14 MED ORDER — ATORVASTATIN CALCIUM 10 MG PO TABS
20.0000 mg | ORAL_TABLET | Freq: Every evening | ORAL | Status: DC
  Administered 2020-09-14: 20 mg via ORAL
  Filled 2020-09-14: qty 2

## 2020-09-14 MED ORDER — SUGAMMADEX SODIUM 200 MG/2ML IV SOLN
INTRAVENOUS | Status: DC | PRN
  Administered 2020-09-14: 160 mg via INTRAVENOUS

## 2020-09-14 MED ORDER — ACETAMINOPHEN 650 MG RE SUPP: 650.0000 mg | RECTAL | Status: DC | PRN

## 2020-09-14 MED ORDER — TAMSULOSIN HCL 0.4 MG PO CAPS
0.4000 mg | ORAL_CAPSULE | Freq: Every day | ORAL | Status: DC
  Administered 2020-09-14 – 2020-09-15 (×2): 0.4 mg via ORAL
  Filled 2020-09-14 (×2): qty 1

## 2020-09-14 MED ORDER — THROMBIN 5000 UNITS EX SOLR
CUTANEOUS | Status: AC
  Filled 2020-09-14: qty 5000

## 2020-09-14 MED ORDER — LACTATED RINGERS IV SOLN: INTRAVENOUS | Status: DC

## 2020-09-14 MED ORDER — PHENOL 1.4 % MT LIQD: 1.0000 | OROMUCOSAL | Status: DC | PRN

## 2020-09-14 MED ORDER — SODIUM CHLORIDE 0.9% FLUSH: 3.0000 mL | INTRAVENOUS | Status: DC | PRN

## 2020-09-14 MED ORDER — CHLORHEXIDINE GLUCONATE 0.12 % MT SOLN
15.0000 mL | Freq: Once | OROMUCOSAL | Status: AC
  Administered 2020-09-14: 15 mL via OROMUCOSAL
  Filled 2020-09-14: qty 15

## 2020-09-14 MED ORDER — GABAPENTIN 300 MG PO CAPS: 300.0000 mg | ORAL_CAPSULE | Freq: Three times a day (TID) | ORAL | Status: DC | PRN

## 2020-09-14 MED ORDER — LEVOTHYROXINE SODIUM 100 MCG PO TABS
100.0000 ug | ORAL_TABLET | Freq: Every day | ORAL | Status: DC
  Administered 2020-09-15: 100 ug via ORAL
  Filled 2020-09-14: qty 1

## 2020-09-14 MED ORDER — PHENYLEPHRINE 40 MCG/ML (10ML) SYRINGE FOR IV PUSH (FOR BLOOD PRESSURE SUPPORT)
PREFILLED_SYRINGE | INTRAVENOUS | Status: DC | PRN
  Administered 2020-09-14: 120 ug via INTRAVENOUS
  Administered 2020-09-14: 160 ug via INTRAVENOUS

## 2020-09-14 MED ORDER — MIDAZOLAM HCL 5 MG/5ML IJ SOLN
INTRAMUSCULAR | Status: DC | PRN
  Administered 2020-09-14: 2 mg via INTRAVENOUS

## 2020-09-14 MED ORDER — HYDROMORPHONE HCL 1 MG/ML IJ SOLN
0.2500 mg | INTRAMUSCULAR | Status: DC | PRN
  Administered 2020-09-14 (×4): 0.5 mg via INTRAVENOUS

## 2020-09-14 MED ORDER — HYDROCODONE-ACETAMINOPHEN 5-325 MG PO TABS: 1.0000 | ORAL_TABLET | ORAL | Status: DC | PRN

## 2020-09-14 MED ORDER — ALPRAZOLAM 0.25 MG PO TABS
0.2500 mg | ORAL_TABLET | Freq: Four times a day (QID) | ORAL | Status: DC | PRN
  Administered 2020-09-14 – 2020-09-15 (×2): 0.25 mg via ORAL
  Filled 2020-09-14 (×2): qty 1

## 2020-09-14 MED ORDER — 0.9 % SODIUM CHLORIDE (POUR BTL) OPTIME
TOPICAL | Status: DC | PRN
  Administered 2020-09-14: 1000 mL

## 2020-09-14 MED ORDER — DEXAMETHASONE SODIUM PHOSPHATE 10 MG/ML IJ SOLN
10.0000 mg | Freq: Once | INTRAMUSCULAR | Status: DC
  Filled 2020-09-14: qty 1

## 2020-09-14 MED ORDER — CEFAZOLIN SODIUM-DEXTROSE 2-4 GM/100ML-% IV SOLN
2.0000 g | INTRAVENOUS | Status: AC
  Administered 2020-09-14: 2 g via INTRAVENOUS
  Filled 2020-09-14: qty 100

## 2020-09-14 MED ORDER — MENTHOL 3 MG MT LOZG: 1.0000 | LOZENGE | OROMUCOSAL | Status: DC | PRN

## 2020-09-14 MED ORDER — LIDOCAINE 2% (20 MG/ML) 5 ML SYRINGE
INTRAMUSCULAR | Status: DC | PRN
  Administered 2020-09-14: 80 mg via INTRAVENOUS

## 2020-09-14 MED ORDER — ONDANSETRON HCL 4 MG/2ML IJ SOLN
INTRAMUSCULAR | Status: DC | PRN
  Administered 2020-09-14: 4 mg via INTRAVENOUS

## 2020-09-14 MED ORDER — PROPOFOL 10 MG/ML IV BOLUS
INTRAVENOUS | Status: AC
  Filled 2020-09-14: qty 40

## 2020-09-14 MED ORDER — METAXALONE 800 MG PO TABS
800.0000 mg | ORAL_TABLET | Freq: Three times a day (TID) | ORAL | Status: DC | PRN
  Administered 2020-09-14: 800 mg via ORAL
  Filled 2020-09-14 (×2): qty 1

## 2020-09-14 MED ORDER — ACETAMINOPHEN 325 MG PO TABS: 650.0000 mg | ORAL_TABLET | ORAL | Status: DC | PRN

## 2020-09-14 MED ORDER — ROCURONIUM BROMIDE 10 MG/ML (PF) SYRINGE
PREFILLED_SYRINGE | INTRAVENOUS | Status: DC | PRN
  Administered 2020-09-14 (×2): 30 mg via INTRAVENOUS
  Administered 2020-09-14: 70 mg via INTRAVENOUS

## 2020-09-14 MED ORDER — CEFAZOLIN SODIUM-DEXTROSE 1-4 GM/50ML-% IV SOLN
1.0000 g | Freq: Three times a day (TID) | INTRAVENOUS | Status: AC
  Administered 2020-09-14 (×2): 1 g via INTRAVENOUS
  Filled 2020-09-14 (×2): qty 50

## 2020-09-14 MED ORDER — BENAZEPRIL HCL 20 MG PO TABS
20.0000 mg | ORAL_TABLET | Freq: Every day | ORAL | Status: DC
  Administered 2020-09-15: 20 mg via ORAL
  Filled 2020-09-14: qty 1

## 2020-09-14 MED ORDER — HYDROMORPHONE HCL 1 MG/ML IJ SOLN
1.0000 mg | INTRAMUSCULAR | Status: DC | PRN
Start: 2020-09-14 — End: 2020-09-15
  Administered 2020-09-14: 1 mg via INTRAVENOUS
  Filled 2020-09-14: qty 1

## 2020-09-14 MED ORDER — THROMBIN 20000 UNITS EX SOLR
CUTANEOUS | Status: AC
  Filled 2020-09-14: qty 20000

## 2020-09-14 MED ORDER — THROMBIN 5000 UNITS EX SOLR: OROMUCOSAL | Status: DC | PRN

## 2020-09-14 MED ORDER — HYDROCODONE-ACETAMINOPHEN 10-325 MG PO TABS
ORAL_TABLET | ORAL | Status: AC
  Filled 2020-09-14: qty 1

## 2020-09-14 MED ORDER — DEXAMETHASONE SODIUM PHOSPHATE 10 MG/ML IJ SOLN
INTRAMUSCULAR | Status: DC | PRN
  Administered 2020-09-14: 10 mg via INTRAVENOUS

## 2020-09-14 MED ORDER — CYCLOBENZAPRINE HCL 10 MG PO TABS
ORAL_TABLET | ORAL | Status: AC
  Filled 2020-09-14: qty 1

## 2020-09-14 MED ORDER — ORAL CARE MOUTH RINSE: 15.0000 mL | Freq: Once | OROMUCOSAL | Status: AC

## 2020-09-14 MED ORDER — SODIUM CHLORIDE 0.9% FLUSH
3.0000 mL | Freq: Two times a day (BID) | INTRAVENOUS | Status: DC
  Administered 2020-09-14: 3 mL via INTRAVENOUS

## 2020-09-14 MED ORDER — METFORMIN HCL 500 MG PO TABS
500.0000 mg | ORAL_TABLET | Freq: Every day | ORAL | Status: DC
  Administered 2020-09-14: 500 mg via ORAL
  Filled 2020-09-14: qty 1

## 2020-09-14 SURGICAL SUPPLY — 58 items
APL SKNCLS STERI-STRIP NONHPOA (GAUZE/BANDAGES/DRESSINGS) ×1
BAG DECANTER FOR FLEXI CONT (MISCELLANEOUS) ×2 IMPLANT
BAND INSRT 18 STRL LF DISP RB (MISCELLANEOUS) ×2
BAND RUBBER #18 3X1/16 STRL (MISCELLANEOUS) ×4 IMPLANT
BENZOIN TINCTURE PRP APPL 2/3 (GAUZE/BANDAGES/DRESSINGS) ×2 IMPLANT
BUR CUTTER 7.0 ROUND (BURR) ×1 IMPLANT
BUR MATCHSTICK NEURO 3.0 LAGG (BURR) ×2 IMPLANT
CAGE PEEK 6X14X11 (Cage) ×2 IMPLANT
CAGE PEEK 7X14X11 (Cage) ×2 IMPLANT
CAGE PEEK 8X14X11 (Cage) ×1 IMPLANT
CANISTER SUCT 3000ML PPV (MISCELLANEOUS) ×2 IMPLANT
CARTRIDGE OIL MAESTRO DRILL (MISCELLANEOUS) ×1 IMPLANT
COVER WAND RF STERILE (DRAPES) ×2 IMPLANT
DIFFUSER DRILL AIR PNEUMATIC (MISCELLANEOUS) ×2 IMPLANT
DRAPE C-ARM 42X72 X-RAY (DRAPES) ×4 IMPLANT
DRAPE LAPAROTOMY 100X72 PEDS (DRAPES) ×2 IMPLANT
DRAPE MICROSCOPE LEICA (MISCELLANEOUS) ×2 IMPLANT
DURAPREP 6ML APPLICATOR 50/CS (WOUND CARE) ×2 IMPLANT
ELECT COATED BLADE 2.86 ST (ELECTRODE) ×2 IMPLANT
ELECT REM PT RETURN 9FT ADLT (ELECTROSURGICAL) ×2
ELECTRODE REM PT RTRN 9FT ADLT (ELECTROSURGICAL) ×1 IMPLANT
GAUZE 4X4 16PLY RFD (DISPOSABLE) IMPLANT
GAUZE SPONGE 4X4 12PLY STRL (GAUZE/BANDAGES/DRESSINGS) ×2 IMPLANT
GLOVE EXAM NITRILE XL STR (GLOVE) IMPLANT
GLOVE SURG LTX SZ9 (GLOVE) ×2 IMPLANT
GOWN STRL REUS W/ TWL LRG LVL3 (GOWN DISPOSABLE) IMPLANT
GOWN STRL REUS W/ TWL XL LVL3 (GOWN DISPOSABLE) IMPLANT
GOWN STRL REUS W/TWL 2XL LVL3 (GOWN DISPOSABLE) IMPLANT
GOWN STRL REUS W/TWL LRG LVL3 (GOWN DISPOSABLE) ×4
GOWN STRL REUS W/TWL XL LVL3 (GOWN DISPOSABLE) ×4
HALTER HD/CHIN CERV TRACTION D (MISCELLANEOUS) ×2 IMPLANT
HEMOSTAT POWDER KIT SURGIFOAM (HEMOSTASIS) ×3 IMPLANT
KIT BASIN OR (CUSTOM PROCEDURE TRAY) ×2 IMPLANT
KIT TURNOVER KIT B (KITS) ×2 IMPLANT
NDL SPNL 20GX3.5 QUINCKE YW (NEEDLE) ×1 IMPLANT
NEEDLE SPNL 20GX3.5 QUINCKE YW (NEEDLE) ×2 IMPLANT
NS IRRIG 1000ML POUR BTL (IV SOLUTION) ×2 IMPLANT
OIL CARTRIDGE MAESTRO DRILL (MISCELLANEOUS) ×2
PACK LAMINECTOMY NEURO (CUSTOM PROCEDURE TRAY) ×2 IMPLANT
PAD ARMBOARD 7.5X6 YLW CONV (MISCELLANEOUS) ×6 IMPLANT
PLATE 3 62.5XLCK NS SPNE CVD (Plate) IMPLANT
PLATE 3 ATLANTIS TRANS (Plate) ×2 IMPLANT
SCREW ST FIX 4 ATL 3120213 (Screw) ×8 IMPLANT
SPACER SPNL 11X14X6XPEEK CVD (Cage) IMPLANT
SPACER SPNL 11X14X7XPEEK CVD (Cage) IMPLANT
SPCR SPNL 11X14X6XPEEK CVD (Cage) ×1 IMPLANT
SPCR SPNL 11X14X7XPEEK CVD (Cage) ×1 IMPLANT
SPONGE INTESTINAL PEANUT (DISPOSABLE) ×2 IMPLANT
SPONGE SURGIFOAM ABS GEL 100 (HEMOSTASIS) ×2 IMPLANT
STRIP CLOSURE SKIN 1/2X4 (GAUZE/BANDAGES/DRESSINGS) ×2 IMPLANT
SUT VIC AB 3-0 SH 8-18 (SUTURE) ×2 IMPLANT
SUT VIC AB 4-0 RB1 18 (SUTURE) ×2 IMPLANT
TAPE CLOTH 4X10 WHT NS (GAUZE/BANDAGES/DRESSINGS) ×2 IMPLANT
TAPE CLOTH SURG 4X10 WHT LF (GAUZE/BANDAGES/DRESSINGS) ×1 IMPLANT
TOWEL GREEN STERILE (TOWEL DISPOSABLE) ×2 IMPLANT
TOWEL GREEN STERILE FF (TOWEL DISPOSABLE) ×2 IMPLANT
TRAP SPECIMEN MUCUS 40CC (MISCELLANEOUS) ×2 IMPLANT
WATER STERILE IRR 1000ML POUR (IV SOLUTION) ×2 IMPLANT

## 2020-09-14 NOTE — Anesthesia Procedure Notes (Signed)
Procedure Name: Intubation Date/Time: 09/14/2020 8:22 AM Performed by: Elliot Dally, CRNA Pre-anesthesia Checklist: Patient identified, Emergency Drugs available, Suction available and Patient being monitored Patient Re-evaluated:Patient Re-evaluated prior to induction Oxygen Delivery Method: Circle System Utilized Preoxygenation: Pre-oxygenation with 100% oxygen Induction Type: IV induction and Rapid sequence Laryngoscope Size: Glidescope and 4 Grade View: Grade I Tube type: Oral Tube size: 7.5 mm Number of attempts: 1 Airway Equipment and Method: Stylet and Oral airway Placement Confirmation: ETT inserted through vocal cords under direct vision, positive ETCO2 and breath sounds checked- equal and bilateral Secured at: 22 cm Tube secured with: Tape Dental Injury: Teeth and Oropharynx as per pre-operative assessment  Comments: Recommend glide scope T3 blade for future intubation. Small, anterior airway.

## 2020-09-14 NOTE — Brief Op Note (Signed)
09/14/2020  11:18 AM  PATIENT:  Kevin Olsen  60 y.o. male  PRE-OPERATIVE DIAGNOSIS:  Stenosis  POST-OPERATIVE DIAGNOSIS:  Stenosis  PROCEDURE:  Procedure(s): CERVICAL FOUR-FIVE, CERVICAL FIVE-SIX, CERVICAL SIX-SEVEN ANTERIOR CERVICAL DECOMPRESSION/DISCECTOMY FUSION (N/A)  SURGEON:  Surgeon(s) and Role:    * Julio Sicks, MD - Primary  PHYSICIAN ASSISTANT:   ASSISTANTSMarland Mcalpine   ANESTHESIA:   general  EBL:  400 mL   BLOOD ADMINISTERED:none  DRAINS: (med) Hemovact drain(s) in the prevertebral space with  Suction Open   LOCAL MEDICATIONS USED:  NONE  SPECIMEN:  No Specimen  DISPOSITION OF SPECIMEN:  N/A  COUNTS:  YES  TOURNIQUET:  * No tourniquets in log *  DICTATION: .Dragon Dictation  PLAN OF CARE: Admit to inpatient   PATIENT DISPOSITION:  PACU - hemodynamically stable.   Delay start of Pharmacological VTE agent (>24hrs) due to surgical blood loss or risk of bleeding: yes

## 2020-09-14 NOTE — Op Note (Signed)
Date of procedure: 09/14/2020  Date of dictation: Same  Service: Neurosurgery  Preoperative diagnosis: C4-5, C5-6, C6-7 spondylosis with stenosis and myelopathy  C5-6, C6-7 severe anterior osteophyte disease with dysphagia  Postoperative diagnosis: Same  Procedure Name:  C4-5, C5-6, C6-7 anterior cervical discectomy with interbody fusion utilizing interbody cages, locally harvested autograft, and anterior plate instrumentation  Resection of extremely large anterior cervical osteophytes at C5-6 and C6-7 for treatment of dysphagia, resection in excess of what would be required for a typical anterior cervical discectomy.  Surgeon:Maylee Bare A.Lacosta Hargan, M.D.  Asst. Surgeon: Doran Durand, NP  Anesthesia: General  Indication: 60 year old male with bilateral upper extremity numbness paresthesias and increasing weakness with gait instability.  Work-up demonstrates evidence of severe DISH involving his cervical spine with a mobile segment at C4-5 with associated stenosis and high signal abnormality at C4-5.  Patient also with severe stenosis and severe foraminal stenosis at C5-6 and C6-7.  He presents now for 3 level anterior cervical decompression and fusion for hopeful improvement of his symptoms.  Patient has a longstanding and worsening dysphagia.  He is been undergoing evaluation for this.  Work-up demonstrates evidence of severe osteophytic disease at C5-6 and C6-7 with marked compression of his esophagus.  He presents now for concurrent resection of his anterior osteophytes for relief of his dysphagia.  The patient is aware of the risks and benefits of surgery and wishes to proceed.  Operative note: After induction of anesthesia, patient position supine with neck slightly extended and held placed halter traction.  Patient's anterior cervical region prepped and draped sterilely.  Incision made overlying C5-6.  Dissection performed on the right.  Extremely large anterior osteophytes projected anteriorly at C5-6  and C6-7.  These were dissected free.  The osteophytes were then sequentially removed at both levels using Leksell rongeurs Kerrison rongeurs high-speed drill and other instruments.  Intraoperative fluoroscopy was used to gauge the extent of the resection and I believe I resected the anterior osteophytes down to the level of the more normal vertebral bodies at these levels.  Self-retaining tractor was then placed.  The spaces at C4-5, C5-6 and C6-7 were incised.  Discectomies were then cleaned of all soft tissue using various instruments down 12 the posterior annulus.  Microscope then brought to the field used out the remainder of the discectomy remaining aspects of annulus and osteophytes removed using high-speed drill down to level of posterior annulus.  High-speed drill was then used to remove the remaining annulus and osteophytes down to level of posterior logical limb.  Posterior margin was then elevated and resected piecemeal fashion underlying thecal sac was identified.  Wide central decompression was then performed undercutting the bodies of C4 and C5.  Decompression then proceeded external foramina.  Wide anterior foraminotomies were performed along the course exiting C5 nerve roots bilaterally.  At this point a very thorough decompression had been achieved.  There is no evidence of injury to the thecal sac or nerve roots.  Procedures then repeated at C5-6 and C67 again without complications.  Wound was then irrigated with antibiotic solution.  Gelfoam was placed topically for hemostasis then removed.  [employer] anatomic peek cages were then packed with locally harvested autograft.  Each cage was then impacted in place and recessed slightly from the anterior cortical margin.  Atlantis translational anterior cervical plate was then placed over the C4, C5, C6 and C7 levels.  This then attached under fluoroscopic guidance and 13 mm fixed angle screws to each at all 4 levels.  All screws given final tightening.   Locking screws engaged all levels.  Final images reveal good position of the cages and the hardware at the proper upper level with normal alignment of the spine.  Wound is inspected for hemostasis and hemostasis was assured with bipolar cautery.  A medium Hemovac drain was left in the deep wound space.  Wounds then closed in layers with Vicryl sutures.  Steri-Strips and sterile dressing were applied.  No apparent complications.  Patient tolerated the procedure well and he he returns to the recovery room postop.

## 2020-09-14 NOTE — Anesthesia Postprocedure Evaluation (Signed)
Anesthesia Post Note  Patient: Kevin Olsen  Procedure(s) Performed: CERVICAL FOUR-FIVE, CERVICAL FIVE-SIX, CERVICAL SIX-SEVEN ANTERIOR CERVICAL DECOMPRESSION/DISCECTOMY FUSION (Spine Cervical)     Patient location during evaluation: PACU Anesthesia Type: General Level of consciousness: awake and alert Pain management: pain level controlled Vital Signs Assessment: post-procedure vital signs reviewed and stable Respiratory status: spontaneous breathing, nonlabored ventilation and respiratory function stable Cardiovascular status: blood pressure returned to baseline and stable Postop Assessment: no apparent nausea or vomiting Anesthetic complications: no   No notable events documented.  Last Vitals:  Vitals:   09/14/20 1235 09/14/20 1316  BP: 109/76 123/87  Pulse: 74 96  Resp: 11 20  Temp: (!) 36.4 C 36.7 C  SpO2: 95% 97%    Last Pain:  Vitals:   09/14/20 1410  TempSrc:   PainSc: 8                  Treysean Petruzzi,W. EDMOND

## 2020-09-14 NOTE — Transfer of Care (Signed)
Immediate Anesthesia Transfer of Care Note  Patient: EH SESAY  Procedure(s) Performed: CERVICAL FOUR-FIVE, CERVICAL FIVE-SIX, CERVICAL SIX-SEVEN ANTERIOR CERVICAL DECOMPRESSION/DISCECTOMY FUSION (Spine Cervical)  Patient Location: PACU  Anesthesia Type:General  Level of Consciousness: drowsy  Airway & Oxygen Therapy: Patient Spontanous Breathing and Patient connected to nasal cannula oxygen  Post-op Assessment: Report given to RN and Post -op Vital signs reviewed and stable  Post vital signs: Reviewed and stable  Last Vitals:  Vitals Value Taken Time  BP 140/96 09/14/20 1134  Temp    Pulse 102 09/14/20 1139  Resp 13 09/14/20 1139  SpO2 100 % 09/14/20 1139  Vitals shown include unvalidated device data.  Last Pain:  Vitals:   09/14/20 0649  TempSrc:   PainSc: 8       Patients Stated Pain Goal: 5 (09/14/20 0649)  Complications: No notable events documented.

## 2020-09-14 NOTE — H&P (Signed)
Kevin Olsen is an 60 y.o. male.   Chief Complaint: Weakness HPI: 60 year old male with progressive bilateral upper and lower extremity weakness and loss of coordination.  Work-up demonstrates evidence of significant spinal stenosis with spinal cord compression and high signal abnormality at C4-5.  Patient also with marked spondylosis and stenosis at C5-6 and C6-7.  Patient also with history of longstanding severe dysphagia with severe anterior osteophyte disease at C5-6 and C6-7 which will also be addressed.  Plan today is for admission and subsequent anterior cervical decompression and fusion at C4 556 and 6 7.  Patient has recently tested positive with COVID.  He is currently asymptomatic.  He notes some upper respiratory symptoms a few weeks ago but has no further symptoms.  He denies any fevers.  He is having no shortness of breath.  He is having no productive cough.  Past Medical History:  Diagnosis Date   Abdominal pain    lower abd pain   AC (acromioclavicular) joint bone spurs    Arthritis    Asthma    as a teenager   Central apnea    Diabetes mellitus without complication (HCC)    Esophageal pain    Fatty liver    History of kidney stones    Hypertension    Hypothyroidism    Neck pain    neck and back pain   Sleep apnea    central apnea- while on oxycodone.  11/06/2018 not current    Past Surgical History:  Procedure Laterality Date   back fusion     with screws and bolts   COLONOSCOPY     ELBOW SURGERY     left   FRACTURE SURGERY     KNEE SURGERY     right knee   NASAL FRACTURE SURGERY      Family History  Problem Relation Age of Onset   Lung cancer Mother    Prostate cancer Father    Social History:  reports that he quit smoking about 37 years ago. His smoking use included cigarettes. He has never used smokeless tobacco. He reports that he does not drink alcohol and does not use drugs.  Allergies: No Known Allergies  Medications Prior to Admission   Medication Sig Dispense Refill   ALPRAZolam (XANAX) 0.25 MG tablet Take 0.25 mg by mouth every 6 (six) hours as needed for anxiety.     atorvastatin (LIPITOR) 20 MG tablet Take 20 mg by mouth every evening.     benazepril (LOTENSIN) 20 MG tablet Take 20 mg by mouth daily.     gabapentin (NEURONTIN) 300 MG capsule Take 300 mg by mouth 3 (three) times daily as needed (pain).     levothyroxine (SYNTHROID) 100 MCG tablet Take 100 mcg by mouth daily before breakfast.     metaxalone (SKELAXIN) 800 MG tablet Take 800 mg by mouth 3 (three) times daily as needed for muscle spasms.     metFORMIN (GLUCOPHAGE) 500 MG tablet Take 500 mg by mouth at bedtime.      tamsulosin (FLOMAX) 0.4 MG CAPS capsule Take 0.4 mg by mouth daily.     traMADol (ULTRAM) 50 MG tablet Take 50 mg by mouth every 6 (six) hours as needed for severe pain.      Results for orders placed or performed during the hospital encounter of 09/14/20 (from the past 48 hour(s))  Glucose, capillary     Status: Abnormal   Collection Time: 09/14/20  6:22 AM  Result Value Ref Range  Glucose-Capillary 118 (H) 70 - 99 mg/dL    Comment: Glucose reference range applies only to samples taken after fasting for at least 8 hours.   No results found.  Pertinent items noted in HPI and remainder of comprehensive ROS otherwise negative.  Blood pressure 100/65, pulse 83, temperature 98.5 F (36.9 C), temperature source Oral, resp. rate 18, height 5\' 11"  (1.803 m), weight 86.2 kg, SpO2 96 %.  Patient is awake and alert.  He is oriented and appropriate.  Speech is fluent.  Judgment insight are intact.  Cranial nerve function normal bilateral.  Motor examination extremities reveals weakness of his grips and intrinsics bilaterally.  He has some mild deltoid weakness on the right side.  He has 4+/5 spastic weakness of both lower extremities.  Sensory examination with patchy distal sensory loss in both upper extremities.  Reflexes are increased.  Toes are  upgoing.  Hoffmann's responses are present in both hands.  Examination head ears eyes nose throat is unremarked.  Chest and abdomen are benign.  Extremities are free from injury or deformity. Assessment/Plan Cervical stenosis with myelopathy.  Plan C4-5, C5-6, C6-7 anterior cervical discectomy with interbody fusion.  Patient also with significant preoperative dysphagia.  Previous studies have demonstrated evidence of esophageal compression secondary to very large osteophytes particularly at C6-7.  These will also be addressed with the surgery.  Patient has been given the option as questions.  Appears to understand.  He wishes to proceed with surgery.  A Harvey Lingo 09/14/2020, 7:51 AM

## 2020-09-14 NOTE — Progress Notes (Signed)
Orthopedic Tech Progress Note Patient Details:  Kevin Olsen 1960/12/25 [id number]  RN stated " patient has collar"   Patient ID: MACLEAN FOISTER, male   DOB: 1960-09-15, 60 y.o.   MRN: [id number]  Donald Pore 09/14/2020, 12:16 PM

## 2020-09-15 ENCOUNTER — Encounter (HOSPITAL_COMMUNITY): Payer: Self-pay | Admitting: Neurosurgery

## 2020-09-15 LAB — GLUCOSE, CAPILLARY: Glucose-Capillary: 120 mg/dL — ABNORMAL HIGH (ref 70–99)

## 2020-09-15 MED ORDER — HYDROCODONE-ACETAMINOPHEN 10-325 MG PO TABS: 1.0000 | ORAL_TABLET | ORAL | 0 refills | Status: AC | PRN

## 2020-09-15 MED FILL — Thrombin For Soln 5000 Unit: CUTANEOUS | Qty: 5000 | Status: AC

## 2020-09-15 NOTE — OR Nursing (Signed)
After reviewing the intra-op x ray, the implants were updated to reflect the plate implanted in the cervical spine, per the vendor manufacturing number.

## 2020-09-15 NOTE — Discharge Summary (Signed)
Physician Discharge Summary  Patient ID: Kevin Olsen MRN: [id number] DOB/AGE: March 21, 1961 60 y.o.  Admit date: 09/14/2020 Discharge date: 09/15/2020  Admission Diagnoses:  Discharge Diagnoses:  Active Problems:   Cervical spondylosis with myelopathy and radiculopathy   Discharged Condition: good  Hospital Course: Patient admitted to the hospital where he underwent uncomplicated 3 level anterior cervical decompression and fusion.  Postoperatively doing well.  Patient's dysphagia somewhat improved.  His upper and lower extremity symptoms are slightly improved.  He is having no wound issues.  He is standing ambulating and voiding without difficulty.  He is ready for discharge home.  Consults:   Significant Diagnostic Studies:   Treatments:   Discharge Exam: Blood pressure 140/68, pulse (!) 101, temperature 98.6 F (37 C), temperature source Oral, resp. rate 18, height 5\' 11"  (1.803 m), weight 86.2 kg, SpO2 97 %. Awake and alert.  Oriented and appropriate.  Motor and sensory function intact with some mild intrinsic weakness bilaterally.  Sensory examination improved.  Wound clean and dry.  Chest and abdomen benign.  Disposition: Discharge disposition: 01-Home or Self Care        Allergies as of 09/15/2020   No Known Allergies      Medication List     TAKE these medications    ALPRAZolam 0.25 MG tablet Commonly known as: XANAX Take 0.25 mg by mouth every 6 (six) hours as needed for anxiety.   atorvastatin 20 MG tablet Commonly known as: LIPITOR Take 20 mg by mouth every evening.   benazepril 20 MG tablet Commonly known as: LOTENSIN Take 20 mg by mouth daily.   gabapentin 300 MG capsule Commonly known as: NEURONTIN Take 300 mg by mouth 3 (three) times daily as needed (pain).   HYDROcodone-acetaminophen 10-325 MG tablet Commonly known as: NORCO Take 1-2 tablets by mouth every 4 (four) hours as needed for severe pain ((score 7 to 10)).   levothyroxine 100  MCG tablet Commonly known as: SYNTHROID Take 100 mcg by mouth daily before breakfast.   metaxalone 800 MG tablet Commonly known as: SKELAXIN Take 800 mg by mouth 3 (three) times daily as needed for muscle spasms.   metFORMIN 500 MG tablet Commonly known as: GLUCOPHAGE Take 500 mg by mouth at bedtime.   tamsulosin 0.4 MG Caps capsule Commonly known as: FLOMAX Take 0.4 mg by mouth daily.   traMADol 50 MG tablet Commonly known as: ULTRAM Take 50 mg by mouth every 6 (six) hours as needed for severe pain.         Signed: 09/17/2020 Kevin Olsen 09/15/2020, 10:41 AM

## 2020-09-15 NOTE — Discharge Instructions (Signed)
Wound Care Keep incision area dry.  You may remove outer bandage after 2 days and shower.   Do not put any creams, lotions, or ointments on incision. Leave steri-strips on neck.  They will fall off by themselves. Activity Walk each and every day, increasing distance each day. No lifting greater than 5 lbs.  Avoid excessive neck motion. No driving for 2 weeks; may ride as a passenger locally. Wear neck brace at all times except when showering. Diet Resume your normal diet.  Return to Work Will be discussed at you follow up appointment. Call Your Doctor If Any of These Occur Redness, drainage, or swelling at the wound.  Temperature greater than 101 degrees. Severe pain not relieved by pain medication. Increased difficulty swallowing.  Incision starts to come apart. Follow Up Appt Call today and ask for Rebecca for appointment in 1-2 weeks ([phone]) or for problems.  If you have any hardware placed in your spine, you will need an x-ray before your appointment.  

## 2020-09-15 NOTE — Progress Notes (Signed)
Patient is discharged from room 3C11 at this time. Alert and in stable condition. IV site d/c'd and instructions read to patient and spouse with understanding verbalized and all questions answered. Left unit via wheelchair with all belongings at side. 

## 2020-09-15 NOTE — Progress Notes (Signed)
Occupational Therapy Treatment Patient Details Name: Kevin Olsen MRN: [id number] DOB: Sep 05, 1960 Today's Date: 09/15/2020    History of present illness 60 yo male s/p C4-5 ,C5-6, C6-7 ACDF on 6/27. PMH incuding arthritis, asthma, DM, HTN, R knee sx, and L elbow sx.   OT comments  PTA, pt was living with his wife and was independent. Currently, pt performing ADLs and functional mobility at Mod I level with increased time. Provided education and handout on cervical precaution, collar management, UB ADLs, LB ADLs, toileting, and tub transfer; pt demonstrated understanding. Answered all pt questions. Recommend dc home once medically stable per physician. All acute OT needs met and will sign off. Thank you.   Follow Up Recommendations  No OT follow up;Supervision - Intermittent    Equipment Recommendations  None recommended by OT    Recommendations for Other Services      Precautions / Restrictions Restrictions Weight Bearing Restrictions: No       Mobility Bed Mobility               General bed mobility comments: Reviewed log roll technique. Pt sitting at EOB upon arrival    Transfers Overall transfer level: Modified independent                    Balance Overall balance assessment: No apparent balance deficits (not formally assessed)                                         ADL either performed or assessed with clinical judgement   ADL Overall ADL's : Modified independent                                       General ADL Comments: Pt performing ADLs and functional mobility at Mod I level. Providing education on cervical precautions, bed mobility, grooming, UB ADLs, LB ADLs, toileting, and tub transfers.     Vision Baseline Vision/History: Wears glasses Patient Visual Report: No change from baseline     Perception     Praxis      Cognition Arousal/Alertness: Awake/alert Behavior During Therapy: WFL for tasks  assessed/performed Overall Cognitive Status: Within Functional Limits for tasks assessed                                          Exercises     Shoulder Instructions       General Comments Wife present throughout    Pertinent Vitals/ Pain       Pain Assessment: Faces Faces Pain Scale: Hurts little more Pain Location: Neck Pain Descriptors / Indicators: Constant;Discomfort Pain Intervention(s): Monitored during session;Limited activity within patient's tolerance;Repositioned  Home Living Family/patient expects to be discharged to:: Private residence Living Arrangements: Spouse/significant other Available Help at Discharge: Family;Available 24 hours/day Type of Home: House Home Access: Ramped entrance     Home Layout: One level     Bathroom Shower/Tub: Teacher, early years/pre: Handicapped height     Home Equipment: Grab bars - toilet;Shower seat          Prior Functioning/Environment Level of Independence: Independent        Comments: On disability   Frequency  Progress Toward Goals  OT Goals(current goals can now be found in the care plan section)     Acute Rehab OT Goals Patient Stated Goal: Go home OT Goal Formulation: All assessment and education complete, DC therapy  Plan      Co-evaluation                 AM-PAC OT "6 Clicks" Daily Activity     Outcome Measure   Help from another person eating meals?: None Help from another person taking care of personal grooming?: None Help from another person toileting, which includes using toliet, bedpan, or urinal?: None Help from another person bathing (including washing, rinsing, drying)?: None Help from another person to put on and taking off regular upper body clothing?: None Help from another person to put on and taking off regular lower body clothing?: None 6 Click Score: 24    End of Session Equipment Utilized During Treatment: Gait belt;Cervical  collar  OT Visit Diagnosis: Other abnormalities of gait and mobility (R26.89);Unsteadiness on feet (R26.81);Muscle weakness (generalized) (M62.81)   Activity Tolerance Patient tolerated treatment well   Patient Left with call bell/phone within reach;in bed;with family/visitor present   Nurse Communication Mobility status        Time: 9924-2683 OT Time Calculation (min): 20 min  Charges: OT General Charges $OT Visit: 1 Visit OT Evaluation $OT Eval Low Complexity: [place], OTR/L Acute Rehab Pager: [phone] Office: [place] 09/15/2020, 11:14 AM

## 2021-03-29 DIAGNOSIS — M160 Bilateral primary osteoarthritis of hip: Secondary | ICD-10-CM | POA: Diagnosis not present

## 2021-04-27 DIAGNOSIS — M160 Bilateral primary osteoarthritis of hip: Secondary | ICD-10-CM | POA: Diagnosis not present

## 2021-04-27 DIAGNOSIS — Z79899 Other long term (current) drug therapy: Secondary | ICD-10-CM | POA: Diagnosis not present

## 2021-04-27 DIAGNOSIS — Z9889 Other specified postprocedural states: Secondary | ICD-10-CM | POA: Diagnosis not present

## 2021-04-28 DIAGNOSIS — E1129 Type 2 diabetes mellitus with other diabetic kidney complication: Secondary | ICD-10-CM | POA: Diagnosis not present

## 2021-04-28 DIAGNOSIS — E291 Testicular hypofunction: Secondary | ICD-10-CM | POA: Diagnosis not present

## 2021-04-28 DIAGNOSIS — E039 Hypothyroidism, unspecified: Secondary | ICD-10-CM | POA: Diagnosis not present

## 2021-04-28 DIAGNOSIS — E785 Hyperlipidemia, unspecified: Secondary | ICD-10-CM | POA: Diagnosis not present

## 2021-04-28 DIAGNOSIS — Z9889 Other specified postprocedural states: Secondary | ICD-10-CM | POA: Diagnosis not present

## 2021-04-28 DIAGNOSIS — I10 Essential (primary) hypertension: Secondary | ICD-10-CM | POA: Diagnosis not present

## 2021-04-30 DIAGNOSIS — M545 Low back pain, unspecified: Secondary | ICD-10-CM | POA: Diagnosis not present

## 2021-04-30 DIAGNOSIS — M160 Bilateral primary osteoarthritis of hip: Secondary | ICD-10-CM | POA: Diagnosis not present

## 2021-05-03 DIAGNOSIS — E1129 Type 2 diabetes mellitus with other diabetic kidney complication: Secondary | ICD-10-CM | POA: Diagnosis not present

## 2021-05-03 DIAGNOSIS — E785 Hyperlipidemia, unspecified: Secondary | ICD-10-CM | POA: Diagnosis not present

## 2021-05-03 DIAGNOSIS — Z1212 Encounter for screening for malignant neoplasm of rectum: Secondary | ICD-10-CM | POA: Diagnosis not present

## 2021-05-03 DIAGNOSIS — I10 Essential (primary) hypertension: Secondary | ICD-10-CM | POA: Diagnosis not present

## 2021-05-03 DIAGNOSIS — R82998 Other abnormal findings in urine: Secondary | ICD-10-CM | POA: Diagnosis not present

## 2021-05-03 DIAGNOSIS — Z0000 Encounter for general adult medical examination without abnormal findings: Secondary | ICD-10-CM | POA: Diagnosis not present

## 2021-05-03 DIAGNOSIS — E039 Hypothyroidism, unspecified: Secondary | ICD-10-CM | POA: Diagnosis not present

## 2021-06-01 DIAGNOSIS — M25552 Pain in left hip: Secondary | ICD-10-CM | POA: Diagnosis not present

## 2021-07-13 DIAGNOSIS — M48062 Spinal stenosis, lumbar region with neurogenic claudication: Secondary | ICD-10-CM | POA: Diagnosis not present

## 2021-07-13 DIAGNOSIS — M160 Bilateral primary osteoarthritis of hip: Secondary | ICD-10-CM | POA: Diagnosis not present

## 2021-07-13 DIAGNOSIS — M4802 Spinal stenosis, cervical region: Secondary | ICD-10-CM | POA: Diagnosis not present

## 2021-07-28 DIAGNOSIS — M1611 Unilateral primary osteoarthritis, right hip: Secondary | ICD-10-CM | POA: Diagnosis not present

## 2021-08-25 DIAGNOSIS — M4802 Spinal stenosis, cervical region: Secondary | ICD-10-CM | POA: Diagnosis not present

## 2021-08-25 DIAGNOSIS — M160 Bilateral primary osteoarthritis of hip: Secondary | ICD-10-CM | POA: Diagnosis not present

## 2021-08-25 DIAGNOSIS — Z6832 Body mass index (BMI) 32.0-32.9, adult: Secondary | ICD-10-CM | POA: Diagnosis not present

## 2021-10-05 DIAGNOSIS — M160 Bilateral primary osteoarthritis of hip: Secondary | ICD-10-CM | POA: Diagnosis not present

## 2021-10-05 DIAGNOSIS — M4802 Spinal stenosis, cervical region: Secondary | ICD-10-CM | POA: Diagnosis not present

## 2021-11-29 DIAGNOSIS — M48062 Spinal stenosis, lumbar region with neurogenic claudication: Secondary | ICD-10-CM | POA: Diagnosis not present

## 2021-11-29 DIAGNOSIS — B351 Tinea unguium: Secondary | ICD-10-CM | POA: Diagnosis not present

## 2021-11-29 DIAGNOSIS — E1129 Type 2 diabetes mellitus with other diabetic kidney complication: Secondary | ICD-10-CM | POA: Diagnosis not present

## 2021-11-29 DIAGNOSIS — I129 Hypertensive chronic kidney disease with stage 1 through stage 4 chronic kidney disease, or unspecified chronic kidney disease: Secondary | ICD-10-CM | POA: Diagnosis not present

## 2021-12-22 DIAGNOSIS — M48062 Spinal stenosis, lumbar region with neurogenic claudication: Secondary | ICD-10-CM | POA: Diagnosis not present

## 2021-12-22 DIAGNOSIS — M4802 Spinal stenosis, cervical region: Secondary | ICD-10-CM | POA: Diagnosis not present

## 2021-12-22 DIAGNOSIS — Z6833 Body mass index (BMI) 33.0-33.9, adult: Secondary | ICD-10-CM | POA: Diagnosis not present

## 2021-12-22 DIAGNOSIS — M160 Bilateral primary osteoarthritis of hip: Secondary | ICD-10-CM | POA: Diagnosis not present

## 2022-01-03 DIAGNOSIS — Z23 Encounter for immunization: Secondary | ICD-10-CM | POA: Diagnosis not present

## 2023-02-14 IMAGING — RF DG CERVICAL SPINE 2 OR 3 VIEWS
1 series · 2 of 2 positions shown · non-contrast
Comparison: CT cervical spine 12/18/2009

CLINICAL DATA: C4-C5, C5-C6, C6-C7 ACDF

EXAM:
DG C-ARM 1-60 MIN; CERVICAL SPINE - 2-3 VIEW
FLUOROSCOPY TIME:  Fluoroscopy Time:  13.4 seconds.
Radiation Exposure Index (if provided by the fluoroscopic device):
1.61 mGy.
Number of Acquired Spot Images: 2

[Series 1: run · 2 of 2 slices shown]
[im 1/2]
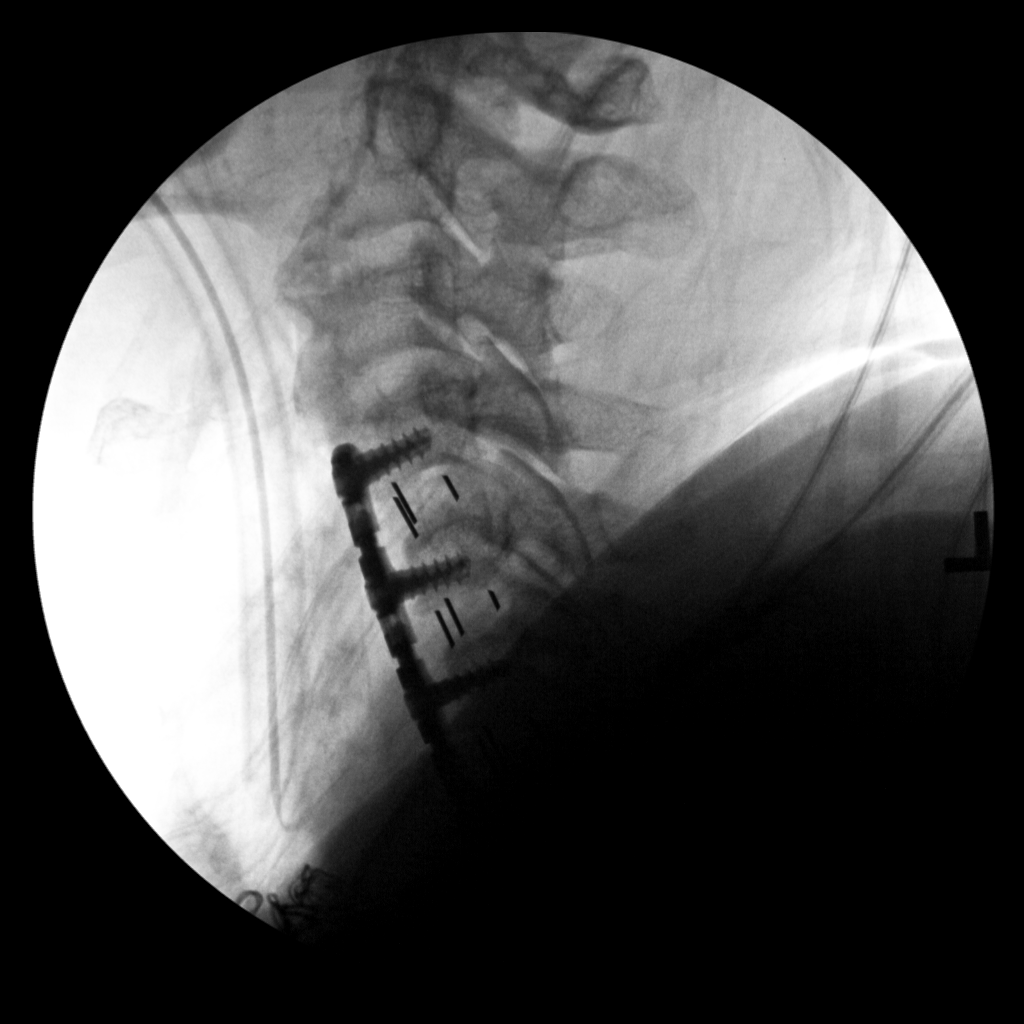
[im 2/2]
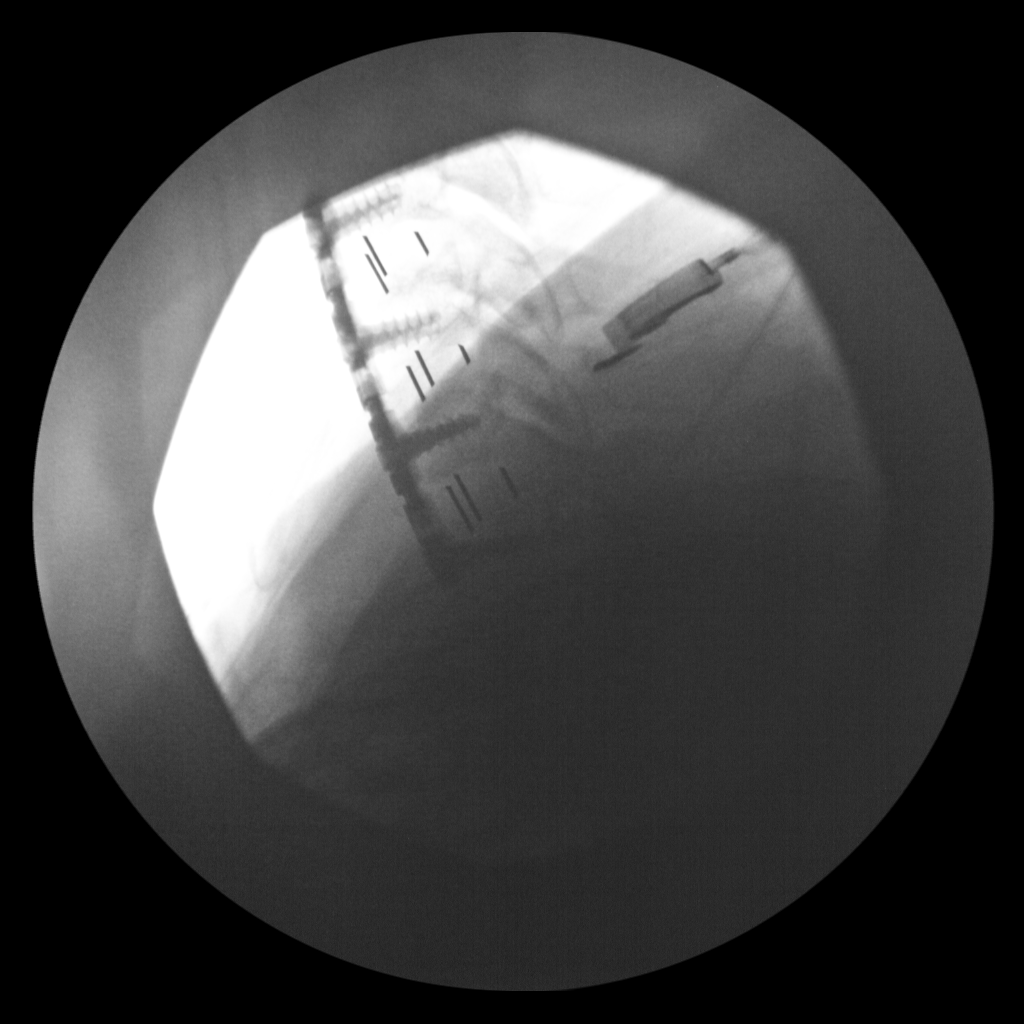

[2 of 2 positions shown; findings below may reference images not displayed]

FINDINGS: Two C-arm fluoroscopic images were obtained intraoperatively and
submitted for post operative interpretation. These images
demonstrate anterior plate and screw fixation spanning C4 through
C7. Intervening spacers at C4-C5, C5-C6, and C6-C7. No unexpected
findings. Please see the performing provider's procedural report for
further detail.
IMPRESSION: C4-C7 ACDF.
